# Patient Record
Sex: Male | Born: 1965 | Race: Black or African American | Hispanic: No | Marital: Single | Smoking: Current every day smoker
Health system: Southern US, Community
[De-identification: ages and names within clinical notes are randomized; demographics above are authoritative.]

## PROBLEM LIST (undated history)

## (undated) DIAGNOSIS — E119 Type 2 diabetes mellitus without complications: Secondary | ICD-10-CM

## (undated) DIAGNOSIS — I10 Essential (primary) hypertension: Secondary | ICD-10-CM

---

## 2018-04-11 ENCOUNTER — Other Ambulatory Visit: Payer: Self-pay

## 2018-04-11 ENCOUNTER — Emergency Department (HOSPITAL_BASED_OUTPATIENT_CLINIC_OR_DEPARTMENT_OTHER): Payer: Self-pay

## 2018-04-11 ENCOUNTER — Inpatient Hospital Stay (HOSPITAL_BASED_OUTPATIENT_CLINIC_OR_DEPARTMENT_OTHER)
Admission: EM | Admit: 2018-04-11 | Discharge: 2018-04-16 | DRG: 871 | Disposition: A | Discharge status: Home / Self Care | Payer: Self-pay | Attending: Internal Medicine | Admitting: Internal Medicine

## 2018-04-11 ENCOUNTER — Encounter (HOSPITAL_BASED_OUTPATIENT_CLINIC_OR_DEPARTMENT_OTHER): Payer: Self-pay | Admitting: *Deleted

## 2018-04-11 DIAGNOSIS — F09 Unspecified mental disorder due to known physiological condition: Secondary | ICD-10-CM | POA: Diagnosis present

## 2018-04-11 DIAGNOSIS — D631 Anemia in chronic kidney disease: Secondary | ICD-10-CM | POA: Diagnosis present

## 2018-04-11 DIAGNOSIS — R945 Abnormal results of liver function studies: Secondary | ICD-10-CM | POA: Diagnosis present

## 2018-04-11 DIAGNOSIS — F4320 Adjustment disorder, unspecified: Secondary | ICD-10-CM | POA: Diagnosis present

## 2018-04-11 DIAGNOSIS — Z79899 Other long term (current) drug therapy: Secondary | ICD-10-CM

## 2018-04-11 DIAGNOSIS — R0600 Dyspnea, unspecified: Secondary | ICD-10-CM

## 2018-04-11 DIAGNOSIS — R4689 Other symptoms and signs involving appearance and behavior: Secondary | ICD-10-CM | POA: Diagnosis not present

## 2018-04-11 DIAGNOSIS — I10 Essential (primary) hypertension: Secondary | ICD-10-CM

## 2018-04-11 DIAGNOSIS — A419 Sepsis, unspecified organism: Secondary | ICD-10-CM

## 2018-04-11 DIAGNOSIS — R0602 Shortness of breath: Secondary | ICD-10-CM

## 2018-04-11 DIAGNOSIS — E1169 Type 2 diabetes mellitus with other specified complication: Secondary | ICD-10-CM | POA: Diagnosis present

## 2018-04-11 DIAGNOSIS — E872 Acidosis, unspecified: Secondary | ICD-10-CM

## 2018-04-11 DIAGNOSIS — I700 Atherosclerosis of aorta: Secondary | ICD-10-CM | POA: Diagnosis present

## 2018-04-11 DIAGNOSIS — F319 Bipolar disorder, unspecified: Secondary | ICD-10-CM | POA: Diagnosis present

## 2018-04-11 DIAGNOSIS — T380X5A Adverse effect of glucocorticoids and synthetic analogues, initial encounter: Secondary | ICD-10-CM | POA: Diagnosis not present

## 2018-04-11 DIAGNOSIS — E1165 Type 2 diabetes mellitus with hyperglycemia: Secondary | ICD-10-CM | POA: Diagnosis present

## 2018-04-11 DIAGNOSIS — Z6838 Body mass index (BMI) 38.0-38.9, adult: Secondary | ICD-10-CM

## 2018-04-11 DIAGNOSIS — E871 Hypo-osmolality and hyponatremia: Secondary | ICD-10-CM | POA: Diagnosis present

## 2018-04-11 DIAGNOSIS — N179 Acute kidney failure, unspecified: Secondary | ICD-10-CM | POA: Diagnosis present

## 2018-04-11 DIAGNOSIS — Z7984 Long term (current) use of oral hypoglycemic drugs: Secondary | ICD-10-CM

## 2018-04-11 DIAGNOSIS — F17200 Nicotine dependence, unspecified, uncomplicated: Secondary | ICD-10-CM | POA: Diagnosis present

## 2018-04-11 DIAGNOSIS — E669 Obesity, unspecified: Secondary | ICD-10-CM | POA: Diagnosis present

## 2018-04-11 DIAGNOSIS — B349 Viral infection, unspecified: Secondary | ICD-10-CM | POA: Diagnosis present

## 2018-04-11 DIAGNOSIS — Y92230 Patient room in hospital as the place of occurrence of the external cause: Secondary | ICD-10-CM

## 2018-04-11 DIAGNOSIS — A4189 Other specified sepsis: Principal | ICD-10-CM | POA: Diagnosis present

## 2018-04-11 DIAGNOSIS — E860 Dehydration: Secondary | ICD-10-CM | POA: Diagnosis present

## 2018-04-11 DIAGNOSIS — R3129 Other microscopic hematuria: Secondary | ICD-10-CM

## 2018-04-11 DIAGNOSIS — Z720 Tobacco use: Secondary | ICD-10-CM

## 2018-04-11 DIAGNOSIS — J189 Pneumonia, unspecified organism: Secondary | ICD-10-CM | POA: Diagnosis present

## 2018-04-11 DIAGNOSIS — K760 Fatty (change of) liver, not elsewhere classified: Secondary | ICD-10-CM | POA: Diagnosis present

## 2018-04-11 DIAGNOSIS — I129 Hypertensive chronic kidney disease with stage 1 through stage 4 chronic kidney disease, or unspecified chronic kidney disease: Secondary | ICD-10-CM | POA: Diagnosis present

## 2018-04-11 DIAGNOSIS — J181 Lobar pneumonia, unspecified organism: Secondary | ICD-10-CM | POA: Diagnosis present

## 2018-04-11 DIAGNOSIS — N182 Chronic kidney disease, stage 2 (mild): Secondary | ICD-10-CM | POA: Diagnosis present

## 2018-04-11 DIAGNOSIS — I722 Aneurysm of renal artery: Secondary | ICD-10-CM | POA: Diagnosis present

## 2018-04-11 HISTORY — DX: Type 2 diabetes mellitus without complications: E11.9

## 2018-04-11 HISTORY — DX: Essential (primary) hypertension: I10

## 2018-04-11 LAB — URINALYSIS, ROUTINE W REFLEX MICROSCOPIC
Bilirubin Urine: NEGATIVE
Ketones, ur: 15 mg/dL — AB
Leukocytes, UA: NEGATIVE
Nitrite: NEGATIVE
PROTEIN: 100 mg/dL — AB
Specific Gravity, Urine: 1.015 (ref 1.005–1.030)
pH: 6 (ref 5.0–8.0)

## 2018-04-11 LAB — RAPID URINE DRUG SCREEN, HOSP PERFORMED
Amphetamines: NOT DETECTED
BENZODIAZEPINES: NOT DETECTED
COCAINE: NOT DETECTED
Opiates: NOT DETECTED
Tetrahydrocannabinol: NOT DETECTED

## 2018-04-11 LAB — COMPREHENSIVE METABOLIC PANEL
ALBUMIN: 3.6 g/dL (ref 3.5–5.0)
ALK PHOS: 57 U/L (ref 38–126)
ALT: 34 U/L (ref 17–63)
ANION GAP: 12 (ref 5–15)
AST: 37 U/L (ref 15–41)
BILIRUBIN TOTAL: 2.2 mg/dL — AB (ref 0.3–1.2)
BUN: 13 mg/dL (ref 6–20)
CALCIUM: 8.2 mg/dL — AB (ref 8.9–10.3)
CO2: 20 mmol/L — ABNORMAL LOW (ref 22–32)
CREATININE: 1.27 mg/dL — AB (ref 0.61–1.24)
Chloride: 92 mmol/L — ABNORMAL LOW (ref 101–111)
GFR calc Af Amer: >60 mL/min (ref >60)
GFR calc non Af Amer: >60 mL/min (ref >60)
GLUCOSE: 426 mg/dL — AB (ref 65–99)
Potassium: 3.6 mmol/L (ref 3.5–5.1)
Sodium: 124 mmol/L — ABNORMAL LOW (ref 135–145)
TOTAL PROTEIN: 7.6 g/dL (ref 6.5–8.1)

## 2018-04-11 LAB — BRAIN NATRIURETIC PEPTIDE: B Natriuretic Peptide: 28.6 pg/mL (ref 0.0–100.0)

## 2018-04-11 LAB — URINALYSIS, MICROSCOPIC (REFLEX)

## 2018-04-11 LAB — CBC WITH DIFFERENTIAL/PLATELET
BASOS ABS: 0 10*3/uL (ref 0.0–0.1)
BASOS PCT: 0 %
EOS ABS: 0 10*3/uL (ref 0.0–0.7)
EOS PCT: 0 %
HCT: 38 % — ABNORMAL LOW (ref 39.0–52.0)
Hemoglobin: 13.3 g/dL (ref 13.0–17.0)
LYMPHS ABS: 1.6 10*3/uL (ref 0.7–4.0)
Lymphocytes Relative: 15 %
MCH: 30.2 pg (ref 26.0–34.0)
MCHC: 35 g/dL (ref 30.0–36.0)
MCV: 86.4 fL (ref 78.0–100.0)
Monocytes Absolute: 1.1 10*3/uL — ABNORMAL HIGH (ref 0.1–1.0)
Monocytes Relative: 11 %
Neutro Abs: 7.5 10*3/uL (ref 1.7–7.7)
Neutrophils Relative %: 74 %
Platelets: 224 10*3/uL (ref 150–400)
RBC: 4.4 MIL/uL (ref 4.22–5.81)
RDW: 12.2 % (ref 11.5–15.5)
WBC: 10.2 10*3/uL (ref 4.0–10.5)

## 2018-04-11 LAB — EXPECTORATED SPUTUM ASSESSMENT W GRAM STAIN, RFLX TO RESP C

## 2018-04-11 LAB — CREATININE, SERUM
Creatinine, Ser: 1.29 mg/dL — ABNORMAL HIGH (ref 0.61–1.24)
GFR calc non Af Amer: >60 mL/min (ref >60)

## 2018-04-11 LAB — CBC
HCT: 34.9 % — ABNORMAL LOW (ref 39.0–52.0)
Hemoglobin: 12 g/dL — ABNORMAL LOW (ref 13.0–17.0)
MCH: 29.9 pg (ref 26.0–34.0)
MCHC: 34.4 g/dL (ref 30.0–36.0)
MCV: 87 fL (ref 78.0–100.0)
Platelets: 229 10*3/uL (ref 150–400)
RBC: 4.01 MIL/uL — ABNORMAL LOW (ref 4.22–5.81)
RDW: 12.4 % (ref 11.5–15.5)
WBC: 8.7 10*3/uL (ref 4.0–10.5)

## 2018-04-11 LAB — INFLUENZA PANEL BY PCR (TYPE A & B)
Influenza A By PCR: NEGATIVE
Influenza B By PCR: NEGATIVE

## 2018-04-11 LAB — ETHANOL

## 2018-04-11 LAB — TSH: TSH: 1.612 u[IU]/mL (ref 0.350–4.500)

## 2018-04-11 LAB — CBG MONITORING, ED
GLUCOSE-CAPILLARY: 368 mg/dL — AB (ref 65–99)
Glucose-Capillary: 437 mg/dL — ABNORMAL HIGH (ref 65–99)

## 2018-04-11 LAB — STREP PNEUMONIAE URINARY ANTIGEN: Strep Pneumo Urinary Antigen: NEGATIVE

## 2018-04-11 LAB — RAPID STREP SCREEN (MED CTR MEBANE ONLY): Streptococcus, Group A Screen (Direct): NEGATIVE

## 2018-04-11 LAB — GLUCOSE, CAPILLARY
Glucose-Capillary: 358 mg/dL — ABNORMAL HIGH (ref 65–99)
Glucose-Capillary: 352 mg/dL — ABNORMAL HIGH (ref 65–99)

## 2018-04-11 LAB — PROCALCITONIN: PROCALCITONIN: 0.87 ng/mL

## 2018-04-11 LAB — TROPONIN I

## 2018-04-11 LAB — LACTIC ACID, PLASMA
Lactic Acid, Venous: 1.4 mmol/L (ref 0.5–1.9)
Lactic Acid, Venous: 1.7 mmol/L (ref 0.5–1.9)

## 2018-04-11 LAB — EXPECTORATED SPUTUM ASSESSMENT W REFEX TO RESP CULTURE

## 2018-04-11 LAB — I-STAT CG4 LACTIC ACID, ED
LACTIC ACID, VENOUS: 2.16 mmol/L — AB (ref 0.5–1.9)
Lactic Acid, Venous: 1.53 mmol/L (ref 0.5–1.9)

## 2018-04-11 MED ORDER — IBUPROFEN 400 MG PO TABS
400.0000 mg | ORAL_TABLET | Freq: Once | ORAL | Status: AC
  Administered 2018-04-11: 400 mg via ORAL
  Filled 2018-04-11: qty 1

## 2018-04-11 MED ORDER — ACETAMINOPHEN 325 MG PO TABS
650.0000 mg | ORAL_TABLET | Freq: Four times a day (QID) | ORAL | Status: DC | PRN
  Administered 2018-04-11 – 2018-04-12 (×3): 650 mg via ORAL
  Filled 2018-04-11 (×3): qty 2

## 2018-04-11 MED ORDER — ORAL CARE MOUTH RINSE
15.0000 mL | Freq: Two times a day (BID) | OROMUCOSAL | Status: DC
  Administered 2018-04-13: 15 mL via OROMUCOSAL

## 2018-04-11 MED ORDER — ACETAMINOPHEN 325 MG PO TABS
325.0000 mg | ORAL_TABLET | Freq: Once | ORAL | Status: AC
  Administered 2018-04-11: 325 mg via ORAL
  Filled 2018-04-11: qty 1

## 2018-04-11 MED ORDER — INSULIN ASPART 100 UNIT/ML ~~LOC~~ SOLN
0.0000 [IU] | Freq: Every day | SUBCUTANEOUS | Status: DC
  Administered 2018-04-11: 5 [IU] via SUBCUTANEOUS
  Administered 2018-04-12: 3 [IU] via SUBCUTANEOUS
  Administered 2018-04-14: 5 [IU] via SUBCUTANEOUS

## 2018-04-11 MED ORDER — VANCOMYCIN HCL IN DEXTROSE 1-5 GM/200ML-% IV SOLN
1000.0000 mg | Freq: Once | INTRAVENOUS | Status: AC
  Administered 2018-04-11: 1000 mg via INTRAVENOUS
  Filled 2018-04-11: qty 200

## 2018-04-11 MED ORDER — ONDANSETRON HCL 4 MG/2ML IJ SOLN
4.0000 mg | Freq: Once | INTRAMUSCULAR | Status: AC
  Administered 2018-04-11: 4 mg via INTRAVENOUS
  Filled 2018-04-11: qty 2

## 2018-04-11 MED ORDER — SODIUM CHLORIDE 0.9 % IV SOLN
500.0000 mg | INTRAVENOUS | Status: DC
  Administered 2018-04-11 – 2018-04-15 (×5): 500 mg via INTRAVENOUS
  Filled 2018-04-11 (×7): qty 500

## 2018-04-11 MED ORDER — SODIUM CHLORIDE 0.9 % IV SOLN
1.0000 g | INTRAVENOUS | Status: DC
  Administered 2018-04-11 – 2018-04-14 (×4): 1 g via INTRAVENOUS
  Filled 2018-04-11 (×2): qty 1
  Filled 2018-04-11 (×2): qty 10

## 2018-04-11 MED ORDER — ALBUTEROL SULFATE (2.5 MG/3ML) 0.083% IN NEBU: 2.5000 mg | INHALATION_SOLUTION | RESPIRATORY_TRACT | Status: DC | PRN

## 2018-04-11 MED ORDER — VANCOMYCIN HCL 10 G IV SOLR
2000.0000 mg | Freq: Once | INTRAVENOUS | Status: DC
  Filled 2018-04-11: qty 2000

## 2018-04-11 MED ORDER — GUAIFENESIN ER 600 MG PO TB12
1200.0000 mg | ORAL_TABLET | Freq: Two times a day (BID) | ORAL | Status: DC
  Administered 2018-04-11 – 2018-04-16 (×10): 1200 mg via ORAL
  Filled 2018-04-11 (×10): qty 2

## 2018-04-11 MED ORDER — CHLORHEXIDINE GLUCONATE 0.12 % MT SOLN
15.0000 mL | Freq: Two times a day (BID) | OROMUCOSAL | Status: DC
  Administered 2018-04-12 – 2018-04-16 (×9): 15 mL via OROMUCOSAL
  Filled 2018-04-11 (×10): qty 15

## 2018-04-11 MED ORDER — LACTATED RINGERS IV BOLUS (SEPSIS)
1000.0000 mL | Freq: Once | INTRAVENOUS | Status: AC
  Administered 2018-04-11: 1000 mL via INTRAVENOUS

## 2018-04-11 MED ORDER — SODIUM CHLORIDE 0.9 % IV SOLN
INTRAVENOUS | Status: DC
  Administered 2018-04-11 – 2018-04-13 (×5): via INTRAVENOUS

## 2018-04-11 MED ORDER — IOPAMIDOL (ISOVUE-300) INJECTION 61%
100.0000 mL | Freq: Once | INTRAVENOUS | Status: AC | PRN
  Administered 2018-04-11: 100 mL via INTRAVENOUS

## 2018-04-11 MED ORDER — ENOXAPARIN SODIUM 40 MG/0.4ML ~~LOC~~ SOLN
40.0000 mg | SUBCUTANEOUS | Status: DC
  Administered 2018-04-11 – 2018-04-14 (×4): 40 mg via SUBCUTANEOUS
  Filled 2018-04-11 (×4): qty 0.4

## 2018-04-11 MED ORDER — IPRATROPIUM-ALBUTEROL 0.5-2.5 (3) MG/3ML IN SOLN: 3.0000 mL | Freq: Four times a day (QID) | RESPIRATORY_TRACT | Status: DC

## 2018-04-11 MED ORDER — IPRATROPIUM-ALBUTEROL 0.5-2.5 (3) MG/3ML IN SOLN: 3.0000 mL | Freq: Four times a day (QID) | RESPIRATORY_TRACT | Status: DC | PRN

## 2018-04-11 MED ORDER — PIPERACILLIN-TAZOBACTAM 3.375 G IVPB 30 MIN
3.3750 g | Freq: Once | INTRAVENOUS | Status: AC
  Administered 2018-04-11: 3.375 g via INTRAVENOUS
  Filled 2018-04-11 (×2): qty 50

## 2018-04-11 MED ORDER — PIPERACILLIN-TAZOBACTAM 3.375 G IVPB
3.3750 g | Freq: Three times a day (TID) | INTRAVENOUS | Status: DC
  Filled 2018-04-11: qty 50

## 2018-04-11 MED ORDER — INSULIN ASPART 100 UNIT/ML ~~LOC~~ SOLN
0.0000 [IU] | Freq: Three times a day (TID) | SUBCUTANEOUS | Status: DC
  Administered 2018-04-11: 15 [IU] via SUBCUTANEOUS
  Administered 2018-04-12: 5 [IU] via SUBCUTANEOUS
  Administered 2018-04-12: 8 [IU] via SUBCUTANEOUS
  Administered 2018-04-12: 11 [IU] via SUBCUTANEOUS
  Administered 2018-04-13: 8 [IU] via SUBCUTANEOUS
  Administered 2018-04-13: 15 [IU] via SUBCUTANEOUS
  Administered 2018-04-13: 8 [IU] via SUBCUTANEOUS
  Administered 2018-04-14: 15 [IU] via SUBCUTANEOUS
  Administered 2018-04-14: 20 [IU] via SUBCUTANEOUS
  Administered 2018-04-14: 15 [IU] via SUBCUTANEOUS

## 2018-04-11 MED ORDER — ACETAMINOPHEN 500 MG PO TABS
1000.0000 mg | ORAL_TABLET | Freq: Once | ORAL | Status: AC
  Administered 2018-04-11: 1000 mg via ORAL
  Filled 2018-04-11: qty 2

## 2018-04-11 MED ORDER — SODIUM CHLORIDE 0.9 % IV SOLN
INTRAVENOUS | Status: DC
  Administered 2018-04-11: 17:00:00 via INTRAVENOUS

## 2018-04-11 MED ORDER — INSULIN ASPART 100 UNIT/ML ~~LOC~~ SOLN: 0.0000 [IU] | Freq: Three times a day (TID) | SUBCUTANEOUS | Status: DC

## 2018-04-11 MED ORDER — LACTATED RINGERS IV BOLUS (SEPSIS)
800.0000 mL | Freq: Once | INTRAVENOUS | Status: AC
  Administered 2018-04-11: 800 mL via INTRAVENOUS

## 2018-04-11 MED ORDER — SODIUM CHLORIDE 0.9 % IV BOLUS
1000.0000 mL | Freq: Once | INTRAVENOUS | Status: AC
  Administered 2018-04-11: 1000 mL via INTRAVENOUS

## 2018-04-11 NOTE — ED Notes (Signed)
Report to [place], RN at [facility]. 

## 2018-04-11 NOTE — ED Notes (Signed)
Patient transported to CT 

## 2018-04-11 NOTE — ED Notes (Signed)
[facility]

## 2018-04-11 NOTE — Progress Notes (Addendum)
   [facility]. Patient with chief complaint of cough, fever, generalized weakness. Temp 103.2, HR 117, RR 20s in the ED. Also abdominal pain and vomiting, CT A/P without acute abdominal process, but did show lingular pneumonia. Patient started on IV zosyn, vanco. [facility]     Noralee StainJennifer Carliss Quast, DO [facility] [email or web address] Password [email or web address] 04/11/2018, 1:10 PM

## 2018-04-11 NOTE — ED Provider Notes (Signed)
Emergency Department Provider Note   I have reviewed the triage vital signs and the nursing notes.   HISTORY  Chief Complaint Fever   HPI William Marks is a 52 y.o. male with approximately 2 weeks of symptoms.  Patient states that during that time he has had multiple symptoms to include fever as high as 104, cough, abdominal pain, headache, neck pain, all over body aches, productive cough that is brown and sputum color.  Patient states that last 4 to 5 days the nausea is gotten significantly worse and has been vomiting every day.  Worsening abdominal pain and weight loss.  Also dark urine and decreased urine output but no burning or other dysuria.  States he thinks he might have the flu however has not been exposing but it with the flu. No other associated or modifying symptoms.    Past Medical History:  Diagnosis Date  . Diabetes mellitus without complication (HCC)   . Hypertension     There are no active problems to display for this patient.   History reviewed. No pertinent surgical history.    Allergies Patient has no known allergies.  History reviewed. No pertinent family history.  Social History Social History   Tobacco Use  . Smoking status: Current Every Day Smoker  . Smokeless tobacco: Never Used  Substance Use Topics  . Alcohol use: Not Currently  . Drug use: Not on file    Review of Systems  All other systems negative except as documented in the HPI. All pertinent positives and negatives as reviewed in the HPI. ____________________________________________   PHYSICAL EXAM:  VITAL SIGNS: ED Triage Vitals  Enc Vitals Group     BP 04/11/18 0923 (!) 144/90     Pulse Rate 04/11/18 0923 (!) 117     Resp 04/11/18 0923 20     Temp 04/11/18 0923 (!) 103.2 F (39.6 C)     Temp Source 04/11/18 0923 Oral     SpO2 04/11/18 0923 98 %     Weight 04/11/18 0918 (!) 320 lb (145.2 kg)     Height 04/11/18 0918 6\' 4"  (1.93 m)    Constitutional: Alert and  oriented. Well appearing and in no acute distress. Eyes: Conjunctivae are normal. PERRL. EOMI. Head: Atraumatic. Nose: No congestion/rhinnorhea. Mouth/Throat: Mucous membranes are moist.  Oropharynx non-erythematous. Neck: No stridor.  No meningeal signs.   Cardiovascular: tachycardic rate, regular rhythm. Good peripheral circulation. Grossly normal heart sounds.   Respiratory: Normal respiratory effort.  No retractions. Lungs CTAB. Gastrointestinal: Soft and ttp in left upper and lower quadrant with voluntary guarding. Obese, but no obvious distension.  Musculoskeletal: No lower extremity tenderness nor edema. No gross deformities of extremities. Neurologic:  Normal speech and language. No gross focal neurologic deficits are appreciated.  Skin:  Skin is warm, dry and intact. No rash noted.   ____________________________________________   LABS (all labs ordered are listed, but only abnormal results are displayed)  Labs Reviewed  CBC WITH DIFFERENTIAL/PLATELET - Abnormal; Notable for the following components:      Result Value   HCT 38.0 (*)    Monocytes Absolute 1.1 (*)    All other components within normal limits  URINALYSIS, ROUTINE W REFLEX MICROSCOPIC - Abnormal; Notable for the following components:   Glucose, UA >=500 (*)    Hgb urine dipstick LARGE (*)    Ketones, ur 15 (*)    Protein, ur 100 (*)    All other components within normal limits  COMPREHENSIVE METABOLIC PANEL -  Abnormal; Notable for the following components:   Sodium 124 (*)    Chloride 92 (*)    CO2 20 (*)    Glucose, Bld 426 (*)    Creatinine, Ser 1.27 (*)    Calcium 8.2 (*)    Total Bilirubin 2.2 (*)    All other components within normal limits  URINALYSIS, MICROSCOPIC (REFLEX) - Abnormal; Notable for the following components:   Bacteria, UA FEW (*)    All other components within normal limits  CBG MONITORING, ED - Abnormal; Notable for the following components:   Glucose-Capillary 437 (*)    All  other components within normal limits  I-STAT CG4 LACTIC ACID, ED - Abnormal; Notable for the following components:   Lactic Acid, Venous 2.16 (*)    All other components within normal limits  CULTURE, BLOOD (ROUTINE X 2)  RAPID STREP SCREEN ([facility] ONLY)  CULTURE, BLOOD (ROUTINE X 2)  CULTURE, GROUP A STREP [facility])  INFLUENZA PANEL BY PCR (TYPE A & B)  I-STAT CG4 LACTIC ACID, ED   ____________________________________________  EKG   EKG Interpretation  Date/Time:  Thursday April 11 2018 09:44:16 EDT Ventricular Rate:  111 PR Interval:    QRS Duration: 81 QT Interval:  306 QTC Calculation: 416 R Axis:   8 Text Interpretation:  Sinus tachycardia Probable left atrial enlargement No old tracing to compare Confirmed by Marily Memos [phone]) on 04/11/2018 10:02:59 AM       ____________________________________________  RADIOLOGY  Dg Chest 2 View  Result Date: 04/11/2018 CLINICAL DATA:  Fevers, body aches, fatigue. EXAM: CHEST - 2 VIEW COMPARISON:  No prior. FINDINGS: Mediastinum and hilar structures normal. Prominent left mid lung field infiltrate noted consistent pneumonia. No pleural effusion or pneumothorax. Heart size normal. No acute bony abnormality. IMPRESSION: Prominent left mid lung field infiltrate consistent with pneumonia. Followup PA and lateral chest X-ray is recommended in 3-4 weeks following trial of antibiotic therapy to ensure resolution and exclude underlying malignancy. Electronically Signed   By: Maisie Fus  Register   On: 04/11/2018 11:41   Ct Abdomen Pelvis W Contrast  Result Date: 04/11/2018 CLINICAL DATA:  Two week history of fevers, fatigue, and body aches. EXAM: CT ABDOMEN AND PELVIS WITH CONTRAST TECHNIQUE: Multidetector CT imaging of the abdomen and pelvis was performed using the standard protocol following bolus administration of intravenous contrast. CONTRAST:  ISOVUE-300 IOPAMIDOL (ISOVUE-300) INJECTION 61% COMPARISON:  None. FINDINGS: Lower chest:  Partially visualized patchy consolidation in the lingula. Hepatobiliary: Diffuse hepatic steatosis. No focal liver abnormality. The gallbladder is decompressed. No biliary dilatation. Pancreas: Unremarkable. No pancreatic ductal dilatation or surrounding inflammatory changes. Spleen: Normal in size without focal abnormality. Adrenals/Urinary Tract: The adrenal glands are unremarkable. Small bilateral renal simple cyst. Subcentimeter low-density lesion in the upper pole of the left kidney is too small to characterize. No renal or ureteral calculi. No hydronephrosis. The bladder is unremarkable. Stomach/Bowel: Stomach is within normal limits. Appendix appears normal. No evidence of bowel wall thickening, distention, or inflammatory changes. Vascular/Lymphatic: Mild aortic atherosclerosis. 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 No enlarged abdominal or pelvic lymph nodes. Reproductive: Prostate is unremarkable. Other: Small fat containing right inguinal hernia. No free fluid or pneumoperitoneum. Musculoskeletal: No acute or significant osseous findings. Moderate degenerative disc disease at L5-S1. IMPRESSION: 1. Lingular pneumonia. 2.  No acute intra-abdominal process. 3. Diffuse hepatic steatosis. 4. 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 Follow-up CT angiogram of the abdomen in 1-2 years is recommended. This recommendation follows ACR consensus guidelines: White Paper of the ACR Incidental Findings Committee II on Vascular Findings. J Am Coll Radiol [phone]. Electronically Signed   By: Obie Dredge M.D.   On: 04/11/2018 11:50    ____________________________________________   PROCEDURES  Procedure(s) performed:   Procedures  CRITICAL CARE Performed by: Marily Memos Total critical care time: 35 minutes Critical care time was exclusive of separately billable  procedures and treating other patients. Critical care was necessary to treat or prevent imminent or life-threatening deterioration. Critical care was time spent personally by me on the following activities: development of treatment plan with patient and/or surrogate as well as nursing, discussions with consultants, evaluation of patient's response to treatment, examination of patient, obtaining history from patient or surrogate, ordering and performing treatments and interventions, ordering and review of laboratory studies, ordering and review of radiographic studies, pulse oximetry and re-evaluation of patient's condition.  ____________________________________________   INITIAL IMPRESSION / ASSESSMENT AND PLAN / ED COURSE  Code sepsis.  Suspect abdominal source with the abdominal tenderness and the vomiting recently so we will start Zosyn.  Could also be respiratory so will do vancomycin as well. tylneol for fever. Disposition based on results and reevaluation.   CT abdomen/pelvis ok but does show lingular pneumonia. Already had appropriate antibiotics. Still tachypneic/pulse ox as low as 91 but generally around 95. When ambulating got much more dyspneic and subsequently weak, not able to continue walking. Will consult for admission for dehydration/infection.     Pertinent labs & imaging results that were available during my care of the patient were reviewed by me and considered in my medical decision making (see chart for details).  ____________________________________________  FINAL CLINICAL IMPRESSION(S) / ED DIAGNOSES  Final diagnoses:  Community acquired pneumonia of left lung, unspecified part of lung     MEDICATIONS GIVEN DURING THIS VISIT:  Medications  piperacillin-tazobactam (ZOSYN) IVPB 3.375 g (has no administration in time range)  lactated ringers bolus 1,000 mL (0 mLs Intravenous Stopped 04/11/18 1004)    And  lactated ringers bolus 1,000 mL (0 mLs Intravenous Stopped  04/11/18 1133)    And  lactated ringers bolus 800 mL (0 mLs Intravenous Stopped 04/11/18 1242)  piperacillin-tazobactam (ZOSYN) IVPB 3.375 g (0 g Intravenous Stopped 04/11/18 1007)  acetaminophen (TYLENOL) tablet 1,000 mg (1,000 mg Oral Given 04/11/18 0949)  vancomycin (VANCOCIN) IVPB 1000 mg/200 mL premix (0 mg Intravenous Stopped 04/11/18 1131)    Followed by  vancomycin (VANCOCIN) IVPB 1000 mg/200 mL premix (0 mg Intravenous Stopped 04/11/18 1242)  ondansetron (ZOFRAN) injection 4 mg (4 mg Intravenous Given 04/11/18 1015)  iopamidol (ISOVUE-300) 61 % injection 100 mL (100 mLs Intravenous Contrast Given 04/11/18 1109)     NEW OUTPATIENT MEDICATIONS STARTED DURING THIS VISIT:  New Prescriptions   No medications on file    Note:  This note was prepared with assistance of Dragon voice recognition software. Occasional wrong-word or sound-a-like substitutions may have occurred due to the inherent limitations of voice recognition software.   Marily Memos, MD 04/11/18 1446

## 2018-04-11 NOTE — ED Triage Notes (Signed)
Pt reports 2 weeks of intermittent fevers, up to 104, generalized body aches, fatigue, states he hasn't been able to eat, fsbs at triage is 437.

## 2018-04-11 NOTE — Progress Notes (Signed)
Pharmacy Antibiotic Note  William LynchRudolph Colin is a 52 y.o. male admitted on 04/11/2018 with fevers, body aches, fatigue and concern for sepsis of abdominal source. Pharmacy has been consulted for Zosyn dosing.  Vanc 2g IV x 1 and Zosyn 3.375g IV x 1 ordered by the ED-MD  Plan: - Continue Zosyn 3.375g IV every 8 hours  - Will follow-up need for additional Vancomycin dosing - Will continue to follow renal function, culture results, LOT, and antibiotic de-escalation plans   Height: 6\' 4"  (193 cm) Weight: (!) 320 lb (145.2 kg) IBW/kg (Calculated) : 86.8  Temp (24hrs), Avg:103.2 F (39.6 C), Min:103.2 F (39.6 C), Max:103.2 F (39.6 C)  No results for input(s): WBC, CREATININE, LATICACIDVEN, VANCOTROUGH, VANCOPEAK, VANCORANDOM, GENTTROUGH, GENTPEAK, GENTRANDOM, TOBRATROUGH, TOBRAPEAK, TOBRARND, AMIKACINPEAK, AMIKACINTROU, AMIKACIN in the last 168 hours.  CrCl cannot be calculated (No order found.).    No Known Allergies  Antimicrobials this admission: Vanc 6/20 x 1 Zosy 6/20 >>  Dose adjustments this admission: n/a  Microbiology results: pending  Thank you for allowing pharmacy to be a part of this patient's care.  Rolley SimsMartin, Samariyah Cowles Ann 04/11/2018 9:45 AM

## 2018-04-11 NOTE — ED Notes (Signed)
Pt maintained O2 sat of 98% while ambulating; HR 106; pt was not able to ambulate past door of room before becoming too weak to continue; stretcher was brought to pt and pt assisted back to stretcher.

## 2018-04-11 NOTE — H&P (Signed)
History and Physical    William Marks DEY:[id number] DOB: 1966-04-09 DOA: 04/11/2018  PCP:  Vira Browns, NP in [place]   Patient coming from: Home via Travel to [place] for work  Chief Complaint: Feeling Sicking   HPI: William Marks is a 52 y.o. 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  Patient states symptoms started about 2 weeks ago and about 10 days ago he started having some nausea and vomiting daily and then developed some diarrhea on and off with 2-3  loose and sometimes watery movements a day.  Last bowel movement was yesterday was nonbloody  and emesis is also nonbloody.  Patient also admits to having some dizziness and states that he developed progressively worsening shortness of breath and states that even when he lays flat  he is short of breath.  Denies any coughing up any sputum or any urinary complaints.  Denies any dysuria but did admit to having some abdominal cramps and pain on the left side of his abdomen.  Patient also complains of a on and off headache with dizziness but denies any chest pain, flank pain, blurred vision or double vision.  Patient states that for the last 4 days he has been trying to call over to come to the hospital but was unable to because of feeling sick and finally was able to and presented to the [facility] where he was transferred to [facility] long hospital for admission for sepsis secondary to community-acquired lingular pneumonia.  TRH was called to admit this patient for the above complaints.  ED Course: In the ED sepsis protocol was initiated and patient had a chest x-ray as well as a CT of the abdomen and pelvis which revealed patient having a lingular consolidation.  Patient was placed on IV antibiotics and given 2.8 L fluid resuscitation boluses and had basic blood work done.  He was transferred to [facility] hospital  for admission  Review of Systems: As per HPI otherwise 10 point review of systems negative.   Past Medical History:  Diagnosis Date  . Diabetes mellitus without complication ([place])   . Hypertension    SURGICAL HISTORY History reviewed.Only Surgery was Wisdom tooth Extraction and Left Index Finger Surgery   SOCIAL HISTORY Reports that he has been smoking 1PPD from the age of 54 up until a month ago when he decided he wanted to quit.  He has never used smokeless tobacco. He reports that he drank alcohol occasionally. His drug history is not on file but he denies use of illicit substances  ALLERGIES No Known Allergies  FAMILY HISTORY History reviewed. Patient's Father was deceased when patient was 29 months old and no History known about patient's Father. Mom has HTN.   Prior to Admission medications   Medication Sig Start Date End Date Taking? Authorizing Provider  glimepiride (AMARYL) 4 MG tablet Take 4 mg by mouth daily with breakfast.   Yes [provider]  losartan-hydrochlorothiazide (HYZAAR) 100-12.5 MG tablet Take 1 tablet by mouth daily. 02/06/18  Yes [provider]  metFORMIN (GLUCOPHAGE) 1000 MG tablet Take 1,000 mg by mouth 2 (two) times daily with a meal.   Yes [provider]   Physical Exam: Vitals:   04/11/18 1450 04/11/18 1500 04/11/18 1515 04/11/18 1708  BP: (!) 164/95 (!) 158/89  (!) 97/32  Pulse: (!) 112 (!) 107 (!) 111 87  Resp: (!) 22 (!) 26 (!) 26 18  Temp:    99.9 F (37.7 C)  TempSrc:    Oral  SpO2: 99% 96% 96% 95%  Weight:      Height:       Constitutional: WN/WD obese AAM who is ill appearing and slightly diaphoretic  Eyes: Lids and conjunctivae normal, sclerae anicteric  ENMT: External Ears, Nose appear normal. Grossly normal hearing.  Neck: Appears normal, supple, no cervical masses, normal ROM, no appreciable thyromegaly, no JVD Respiratory: Diminished to auscultation bilaterally, no wheezing, rales, rhonchi or crackles.  Normal respiratory effort and patient is not tachypenic. No accessory muscle use at this time.  Cardiovascular: Tachycardic Rate but regular rhythm, no murmurs / rubs / gallops. S1 and S2 auscultated. No extremity edema appreciated.  Abdomen: Soft, non-tender, Distended due to body habitus. No masses palpated. No appreciable hepatosplenomegaly. Bowel sounds positive x4.  GU: Deferred. Musculoskeletal: No clubbing / cyanosis of digits/nails. No joint deformity upper and lower extremities.  Skin: No rashes or lesions on a limited skin evaluation.  No induration; Warm and dry.  Neurologic: CN 2-12 grossly intact with no focal deficits. Romberg sign and cerebellar reflexes not assessed.  Psychiatric: Normal judgment and insight. Alert and oriented x 3. Normal mood and appropriate affect.   Labs on Admission: I have personally reviewed following labs and imaging studies  CBC: Recent Labs  Lab 04/11/18 0947 04/11/18 1717  WBC 10.2 8.7  NEUTROABS 7.5  --   HGB 13.3 12.0*  HCT 38.0* 34.9*  MCV 86.4 87.0  PLT 224 945   Basic Metabolic Panel: Recent Labs  Lab 04/11/18 1025  NA 124*  K 3.6  CL 92*  CO2 20*  GLUCOSE 426*  BUN 13  CREATININE 1.27*  CALCIUM 8.2*   GFR: Estimated Creatinine Clearance: 107.3 mL/min (A) (by C-G formula based on SCr of 1.27 mg/dL (H)). Liver Function Tests: Recent Labs  Lab 04/11/18 1025  AST 37  ALT 34  ALKPHOS 57  BILITOT 2.2*  PROT 7.6  ALBUMIN 3.6   No results for input(s): LIPASE, AMYLASE in the last 168 hours. No results for input(s): AMMONIA in the last 168 hours. Coagulation Profile: No results for input(s): INR, PROTIME in the last 168 hours. Cardiac Enzymes: No results for input(s): CKTOTAL, CKMB, CKMBINDEX, TROPONINI in the last 168 hours. BNP (last 3 results) No results for input(s): PROBNP in the last 8760 hours. HbA1C: No results for input(s): HGBA1C in the last 72 hours. CBG: Recent Labs  Lab 04/11/18 0919 04/11/18 1533  04/11/18 1709  GLUCAP 437* 368* 358*   Lipid Profile: No results for input(s): CHOL, HDL, LDLCALC, TRIG, CHOLHDL, LDLDIRECT in the last 72 hours. Thyroid Function Tests: No results for input(s): TSH, T4TOTAL, FREET4, T3FREE, THYROIDAB in the last 72 hours. Anemia Panel: No results for input(s): VITAMINB12, FOLATE, FERRITIN, TIBC, IRON, RETICCTPCT in the last 72 hours. Urine analysis:    Component Value Date/Time   COLORURINE YELLOW 04/11/2018 [place] 04/11/2018 1139   LABSPEC 1.015 04/11/2018 1139   PHURINE 6.0 04/11/2018 1139   GLUCOSEU >=500 (A) 04/11/2018 1139   HGBUR LARGE (A) 04/11/2018 1139   BILIRUBINUR NEGATIVE 04/11/2018 1139   KETONESUR 15 (A) 04/11/2018 1139   PROTEINUR 100 (A) 04/11/2018 1139   NITRITE NEGATIVE 04/11/2018 1139   LEUKOCYTESUR NEGATIVE 04/11/2018 1139   Sepsis Labs: !!!!!!!!!!!!!!!!!!!!!!!!!!!!!!!!!!!!!!!!!!!! @LABRCNTIP (procalcitonin:4,lacticidven:4) ) Recent Results (from the past 240 hour(s))  Blood Culture (routine x 2)  Status: None (Preliminary result)   Collection Time: 04/11/18  9:47 AM  Result Value Ref Range Status   Specimen Description   Final    BLOOD LEFT FOREARM Performed at [facility], [address] [place], [place] [address]    Special Requests   Final    BOTTLES DRAWN AEROBIC AND ANAEROBIC Blood Culture adequate volume Performed at [facility] [address] [place], [place] [address]    Culture PENDING  Incomplete   Report Status PENDING  Incomplete  Rapid Strep Screen ([facility] & [facility] ONLY)     Status: None   Collection Time: 04/11/18  9:56 AM  Result Value Ref Range Status   Streptococcus, Group A Screen (Direct) NEGATIVE NEGATIVE Final    Comment: (NOTE) A Rapid Antigen test may result negative if the antigen level in the sample is below the detection level of this test. The FDA has not cleared this test as a stand-alone test therefore the rapid antigen negative result has  reflexed to a Group A Strep culture. Performed at [facility], [address] [place], [place] [address]     Radiological Exams on Admission: Dg Chest 2 View  Result Date: 04/11/2018 CLINICAL DATA:  Fevers, body aches, fatigue. EXAM: CHEST - 2 VIEW COMPARISON:  No prior. FINDINGS: Mediastinum and hilar structures normal. Prominent left mid lung field infiltrate noted consistent pneumonia. No pleural effusion or pneumothorax. Heart size normal. No acute bony abnormality. IMPRESSION: Prominent left mid lung field infiltrate consistent with pneumonia. Followup PA and lateral chest X-ray is recommended in 3-4 weeks following trial of antibiotic therapy to ensure resolution and exclude underlying malignancy. Electronically Signed   By: Marcello Moores  Register   On: 04/11/2018 11:41   Ct Abdomen Pelvis W Contrast  Result Date: 04/11/2018 CLINICAL DATA:  Two week history of fevers, fatigue, and body aches. EXAM: CT ABDOMEN AND PELVIS WITH CONTRAST TECHNIQUE: Multidetector CT imaging of the abdomen and pelvis was performed using the standard protocol following bolus administration of intravenous contrast. CONTRAST:  162m ISOVUE-300 IOPAMIDOL (ISOVUE-300) INJECTION 61% COMPARISON:  None. FINDINGS: Lower chest: Partially visualized patchy consolidation in the lingula. Hepatobiliary: Diffuse hepatic steatosis. No focal liver abnormality. The gallbladder is decompressed. No biliary dilatation. Pancreas: Unremarkable. No pancreatic ductal dilatation or surrounding inflammatory changes. Spleen: Normal in size without focal abnormality. Adrenals/Urinary Tract: The adrenal glands are unremarkable. Small bilateral renal simple cyst. Subcentimeter low-density lesion in the upper pole of the left kidney is too small to characterize. No renal or ureteral calculi. No hydronephrosis. The bladder is unremarkable. Stomach/Bowel: Stomach is within normal limits. Appendix appears normal. No evidence of bowel wall  thickening, distention, or inflammatory changes. Vascular/Lymphatic: Mild aortic atherosclerosis. 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 No enlarged abdominal or pelvic lymph nodes. Reproductive: Prostate is unremarkable. Other: Small fat containing right inguinal hernia. No free fluid or pneumoperitoneum. Musculoskeletal: No acute or significant osseous findings. Moderate degenerative disc disease at L5-S1. IMPRESSION: 1. Lingular pneumonia. 2.  No acute intra-abdominal process. 3. Diffuse hepatic steatosis. 4. 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 Follow-up CT angiogram of the abdomen in 1-2 years is recommended. This recommendation follows ACR consensus guidelines: White Paper of the ACR Incidental Findings Committee II on Vascular Findings. J Am Coll Radiol [phone] Electronically Signed   By: WTitus DubinM.D.   On: 04/11/2018 11:50  EKG: Independently reviewed. Showed Sinus Tachycardia with a rate of 111 and no appreciable ST elevation or Depression on my interpretation.  Assessment/Plan Active Problems:   Sepsis due to pneumonia ([place])   Lingular pneumonia   Dyspnea   Essential hypertension   Diabetes mellitus type 2 in obese ([facility])   AKI (acute kidney injury) ([place])   Hyponatremia   Hepatic steatosis   Hyperbilirubinemia   Lactic acidosis   Microscopic hematuria   Tobacco abuse  Sepsis 2/2 to Community Acquired Lingular Pneumonia -Admit to inpatient telemetry -On admission patient met sepsis criteria and was febrile with temperature 103.2, a pulse rate of 117, respiratory rate of 32, lactic acid level 2.16 -Sepsis protocol started in the ED and patient was given 2800 mL of Lactated Ringer boluses; will give an additional 1 L normal saline bolus and start the patient on maintenance fluid IV NS 125 mL's per hour -Lactic acid level was 2.16 on  admission and then trended down to 1.53- -Check Procalcitonin -Started empiric antibiotics and patient received IV vancomycin and IV Zosyn however will change to IV ceftriaxone and IV azithromycin given that this is a community-acquired pneumonia -Chest x-ray showed prominent left midlung field infiltrate consistent with pneumonia and CT scan of the abdomen and pelvis showed a lingular consolidation -Continue with duo nebs 3 mils every 6 scheduled and albuterol every 2 as needed -Started Mucinex 1200 g p.o. twice daily, flutter valve, and incentive spirometer -Will not add IV steroids at this time as patient was not wheezing -Blood cultures x2 obtained in the ED and will obtain a sputum culture -Urinalysis was not really consistent with a urinary tract infection but will still send urinary culture -Follow-up culture results and repeat CBC in the a.m. -Check respiratory virus panel and influenza a and B via PCR and place patient on empiric droplet precautions; Influenza A/B Negative so far -Antipyretics with Acetaminophen every 6 as needed  Dyspnea -Likely from above however we will need to rule out a cardiac etiology  -We will cycle patient's cardiac troponins and check an Echocardiogram  Nausea/Vomiting/Diarrhea -? From Viral illness causing Pneumonia -Supportive Care and Antiemetics -C/w IVF Hydration as below and place on Heart Healthy/Carb Modified Diet   Essential Hypertension but currently running softer BP's -Hold patient's home losartan-hydrochlorothiazide combination low blood pressures and AKI -Continue with IV fluid hydration and will give another bolus of 1 L -Continue to monitor vital signs per protocol  Diabetes Mellitus Type 2 -Hold patient's metformin at thousand milligrams p.o. twice daily along with glimepiride 4 mg p.o. Daily -Check hemoglobin A1c -Placed on moderate NovoLog sliding scale AC and at bedtime -Continue to monitor CBGs; CBG's ranging from 358-437 on  admission  Acute Kidney Injury -Unknown baseline at this time -Likely prerenal in the setting of nausea, vomiting, diarrhea, as well as losartan hydrochlorothiazide combination -Avoid nephrotoxic medications and Hold Losartan-HCTZ -Continue with IV fluid hydration with normal saline at rate of 125 mL/per hour -Sepsis protocol was started in the emergency room patient was bolused 2800 mL's of lactated Ringer's; will give an additional 1 L normal saline bolus and start the patient on maintenance fluid as above -Continue to monitor and repeat CMP in a.m.  Hyponatremia/Pseudohyponatremia -In the Setting of hyperglycemia and dehydration from sepsis nausea and vomiting as well as diarrhea -Corrected sodium is 132 -Continue with IV fluid hydration as above -Repeat CMP in a.m.  Diffuse Hepatic Steatosis -On CT of the abdomen and pelvis with contrast -Checked CMP and LFTs  showed AST of 37 and ALT of 34 -Patient states that he drinks alcohol occasionally and does not drink every day and when asked about diet regimen patient states he does not remember think about food at this time given his nausea -Check Right Upper quadrant ultrasound -Repeat CMP in a.m.  Hyperbilirubinemia -Patient's T bili was 2.2 on Admission  -We will obtain right upper quadrant ultrasound -Continue to monitor and trend and repeat CMP in the a.m.  Lactic Acidosis -In the setting of sepsis and metformin -We will hold metformin and continue IV fluid hydration -Lactic acid level improved and went from 2.16 and is now 1.53  Microscopic Hematuria -History of nephrolithiasis per patient -On admission patient's urinalysis showed a clear appearance large hemoglobin on his urine dipstick and 6-10 red blood cells per high-powered field -Current urine culture is pending -We will repeat urinalysis in 48 hours if still persistent will need outpatient follow-up with Urology  Tobacco Abuse -Patient states he has not been smoking  over a month however he has smoked since the age of 59 and trying to quit -Smoking cessation counseling provided   ? Right Renal Artery Aneurysm -Follow up with [facility] in 1-2 years as an outpatient   DVT prophylaxis: Enoxaparin 40 mg sq q24h Code Status: FULL CODE Family Communication: No family present at bedside  Disposition Plan: Anticipate D/C home in next 48-72 Hours Consults called: None Admission status: Inpatient Telemetry  Severity of Illness: The appropriate patient status for this patient is INPATIENT. Inpatient status is judged to be reasonable and necessary in order to provide the required intensity of service to ensure the patient's safety. The patient's presenting symptoms, physical exam findings, and initial radiographic and laboratory data in the context of their chronic comorbidities is felt to place them at high risk for further clinical deterioration. Furthermore, it is not anticipated that the patient will be medically stable for discharge from the hospital within 2 midnights of admission. The following factors support the patient status of inpatient.   " The patient's presenting symptoms include, nausea, vomiting, diarrhea. " The worrisome physical exam findings include diaphoresis as well as dehydration. " The initial radiographic and laboratory data are worrisome because of AKI and because of lingular consolidation. " The chronic co-morbidities include hypertension and diabetes mellitus type 2.  * I certify that at the point of admission it is my clinical judgment that the patient will require inpatient hospital care spanning beyond 2 midnights from the point of admission due to high intensity of service, high risk for further deterioration and high frequency of surveillance required.Kerney Elbe, D.O. [facility] Pager [phone]  If 7PM-7AM, please contact night-coverage [email or web address] Password [facility]  04/11/2018, 5:59 PM

## 2018-04-12 ENCOUNTER — Inpatient Hospital Stay (HOSPITAL_COMMUNITY): Payer: Self-pay

## 2018-04-12 DIAGNOSIS — I5030 Unspecified diastolic (congestive) heart failure: Secondary | ICD-10-CM

## 2018-04-12 LAB — ECHOCARDIOGRAM COMPLETE
Height: 76 in
WEIGHTICAEL: 5120 [oz_av]

## 2018-04-12 LAB — RESPIRATORY PANEL BY PCR
ADENOVIRUS-RVPPCR: NOT DETECTED
Bordetella pertussis: NOT DETECTED
CHLAMYDOPHILA PNEUMONIAE-RVPPCR: NOT DETECTED
CORONAVIRUS HKU1-RVPPCR: NOT DETECTED
CORONAVIRUS NL63-RVPPCR: NOT DETECTED
Coronavirus 229E: NOT DETECTED
Coronavirus OC43: NOT DETECTED
Influenza A: NOT DETECTED
Influenza B: NOT DETECTED
Metapneumovirus: NOT DETECTED
Mycoplasma pneumoniae: NOT DETECTED
Parainfluenza Virus 1: NOT DETECTED
Parainfluenza Virus 2: NOT DETECTED
Parainfluenza Virus 3: NOT DETECTED
Parainfluenza Virus 4: NOT DETECTED
RHINOVIRUS / ENTEROVIRUS - RVPPCR: NOT DETECTED
Respiratory Syncytial Virus: NOT DETECTED

## 2018-04-12 LAB — COMPREHENSIVE METABOLIC PANEL
ALT: 45 U/L (ref 17–63)
ANION GAP: 8 (ref 5–15)
AST: 52 U/L — ABNORMAL HIGH (ref 15–41)
Albumin: 3.1 g/dL — ABNORMAL LOW (ref 3.5–5.0)
Alkaline Phosphatase: 46 U/L (ref 38–126)
BUN: 15 mg/dL (ref 6–20)
CALCIUM: 8.3 mg/dL — AB (ref 8.9–10.3)
CHLORIDE: 98 mmol/L — AB (ref 101–111)
CO2: 26 mmol/L (ref 22–32)
Creatinine, Ser: 1.11 mg/dL (ref 0.61–1.24)
Glucose, Bld: 309 mg/dL — ABNORMAL HIGH (ref 65–99)
Potassium: 4.1 mmol/L (ref 3.5–5.1)
SODIUM: 132 mmol/L — AB (ref 135–145)
Total Bilirubin: 1.7 mg/dL — ABNORMAL HIGH (ref 0.3–1.2)
Total Protein: 6.9 g/dL (ref 6.5–8.1)

## 2018-04-12 LAB — LEGIONELLA PNEUMOPHILA SEROGP 1 UR AG: L. pneumophila Serogp 1 Ur Ag: NEGATIVE

## 2018-04-12 LAB — CBC WITH DIFFERENTIAL/PLATELET
Basophils Absolute: 0 10*3/uL (ref 0.0–0.1)
Basophils Relative: 0 %
EOS ABS: 0 10*3/uL (ref 0.0–0.7)
EOS PCT: 0 %
HCT: 33.4 % — ABNORMAL LOW (ref 39.0–52.0)
Hemoglobin: 11.4 g/dL — ABNORMAL LOW (ref 13.0–17.0)
LYMPHS ABS: 1.4 10*3/uL (ref 0.7–4.0)
Lymphocytes Relative: 19 %
MCH: 30.2 pg (ref 26.0–34.0)
MCHC: 34.1 g/dL (ref 30.0–36.0)
MCV: 88.6 fL (ref 78.0–100.0)
MONO ABS: 0.6 10*3/uL (ref 0.1–1.0)
MONOS PCT: 8 %
Neutro Abs: 5.3 10*3/uL (ref 1.7–7.7)
Neutrophils Relative %: 73 %
PLATELETS: 207 10*3/uL (ref 150–400)
RBC: 3.77 MIL/uL — ABNORMAL LOW (ref 4.22–5.81)
RDW: 12.4 % (ref 11.5–15.5)
WBC: 7.3 10*3/uL (ref 4.0–10.5)

## 2018-04-12 LAB — MAGNESIUM: MAGNESIUM: 1.7 mg/dL (ref 1.7–2.4)

## 2018-04-12 LAB — TROPONIN I

## 2018-04-12 LAB — GLUCOSE, CAPILLARY
GLUCOSE-CAPILLARY: 225 mg/dL — AB (ref 65–99)
Glucose-Capillary: 294 mg/dL — ABNORMAL HIGH (ref 65–99)
Glucose-Capillary: 316 mg/dL — ABNORMAL HIGH (ref 65–99)
Glucose-Capillary: 259 mg/dL — ABNORMAL HIGH (ref 65–99)

## 2018-04-12 LAB — PHOSPHORUS: PHOSPHORUS: 1.7 mg/dL — AB (ref 2.5–4.6)

## 2018-04-12 LAB — HEMOGLOBIN A1C
HEMOGLOBIN A1C: 9.9 % — AB (ref 4.8–5.6)
MEAN PLASMA GLUCOSE: 237.43 mg/dL

## 2018-04-12 LAB — HIV ANTIBODY (ROUTINE TESTING W REFLEX): HIV SCREEN 4TH GENERATION: NONREACTIVE

## 2018-04-12 MED ORDER — INSULIN STARTER KIT- PEN NEEDLES (ENGLISH)
1.0000 | Freq: Once | Status: AC
  Administered 2018-04-12: 1
  Filled 2018-04-12: qty 1

## 2018-04-12 MED ORDER — POTASSIUM PHOSPHATES 15 MMOLE/5ML IV SOLN
20.0000 mmol | Freq: Once | INTRAVENOUS | Status: AC
  Administered 2018-04-12: 20 mmol via INTRAVENOUS
  Filled 2018-04-12: qty 6.67

## 2018-04-12 MED ORDER — ACETAMINOPHEN 325 MG PO TABS
325.0000 mg | ORAL_TABLET | Freq: Once | ORAL | Status: AC
  Administered 2018-04-12: 325 mg via ORAL
  Filled 2018-04-12: qty 1

## 2018-04-12 MED ORDER — LIVING WELL WITH DIABETES BOOK
Freq: Once | Status: AC
  Administered 2018-04-12: 18:00:00
  Filled 2018-04-12: qty 1

## 2018-04-12 MED ORDER — IBUPROFEN 200 MG PO TABS
400.0000 mg | ORAL_TABLET | Freq: Four times a day (QID) | ORAL | Status: DC | PRN
  Administered 2018-04-12 – 2018-04-13 (×3): 400 mg via ORAL
  Filled 2018-04-12 (×4): qty 2

## 2018-04-12 MED ORDER — ACETAMINOPHEN 650 MG RE SUPP: 650.0000 mg | RECTAL | Status: DC | PRN

## 2018-04-12 NOTE — Progress Notes (Signed)
Inpatient Diabetes Program Recommendations  AACE/ADA: New Consensus Statement on Inpatient Glycemic Control (2015)  Target Ranges:  Prepandial:   less than 140 mg/dL      Peak postprandial:   less than 180 mg/dL (1-2 hours)      Critically ill patients:  140 - 180 mg/dL   Lab Results  Component Value Date   GLUCAP 225 (H) 04/12/2018   HGBA1C 9.9 (H) 04/12/2018    Review of Glycemic Control  CBGs today 294, 225   Glucose 309 HgbA1C - 9.9%  Pt states he does not want to go on insulin. Then states if he does, no one will know about it. Stressed importance of monitoring blood sugars and getting PCP who will manage his diabetes. Discussed importance of getting blood sugars in good control, whether with pills and/or insulin.  Educated patient on insulin pen use at home. Reviewed contents of insulin flexpen starter kit. Reviewed all steps if insulin pen including attachment of needle, 2-unit air shot, dialing up dose, giving injection, removing needle, disposal of sharps, storage of unused insulin, disposal of insulin etc.Also reviewed troubleshooting with insulin pen. MD to give patient Rxs for insulin pens and insulin pen needles.  If pt is to be d/ced on insulin, recommend Novolin 70/30 pen at [facility]. Spoke to pt about cost - $44. He voiced understanding. Would start with 70/30 15-20 units bid.  Continue to follow.   Thank you. Lorenda Peck, RD, LDN, CDE Inpatient Diabetes Coordinator [phone]

## 2018-04-12 NOTE — Progress Notes (Signed)
  Echocardiogram 2D Echocardiogram has been performed.  William Marks G [name] 04/12/2018, 9:49 AM

## 2018-04-12 NOTE — Progress Notes (Signed)
Inpatient Diabetes Program Recommendations  AACE/ADA: New Consensus Statement on Inpatient Glycemic Control (2015)  Target Ranges:  Prepandial:   less than 140 mg/dL      Peak postprandial:   less than 180 mg/dL (1-2 hours)      Critically ill patients:  140 - 180 mg/dL   Lab Results  Component Value Date   GLUCAP 225 (H) 04/12/2018   HGBA1C 9.9 (H) 04/12/2018    Review of Glycemic Control  Diabetes history: DM2 Outpatient Diabetes medications: metformin 1000 mg bid, Amaryl 4 mg QAM  Current orders for Inpatient glycemic control: Novolog 0-15 units tidwc and hs  HgbA1C - 9.9% - Will likely need to go home on insulin.   Inpatient Diabetes Program Recommendations:     Add Lantus 25 units Q24H Add Novolog 4 units tidwc for meal coverage insulin Will need to f/u with PCP for management of his diabetes.  Spoke with pt at length regarding his diabetes diagnosis, importance of glucose control at home, how diet, exercise, and stress affect blood sugars. Instructed pt to check blood sugars at least 3x/day and take logbook to MD appt. Discussed HgbA1C of 9.9% goal of 7%. Will order Living Well with Diabetes book and insulin pen starter kit. Pt is without insurance and will need affordable Novolin 70/30 insulin from [facility] (pen cost - $44) Pt states he has not been taking his metformin or Amaryl lately. Stressed importance of taking meds as prescribed. Pt voiced understanding.  Continue to follow.  Thank you. Lorenda Peck, RD, LDN, CDE Inpatient Diabetes Coordinator [phone]

## 2018-04-12 NOTE — Progress Notes (Signed)
PROGRESS NOTE    William Marks  MRN:[id number] DOB: 03/18/1966 DOA: 04/11/2018 PCP: Patient, No Pcp Per   Brief Narrative:  William Marks is a 52 y.o. 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   Patient states symptoms started about 2 weeks ago and about 10 days ago he started having some nausea and vomiting daily and then developed some diarrhea on and off with 2-3 loose and sometimes watery movements a day.  Last bowel movement was day before admission and was nonbloody and emesis is also nonbloody.  Patient also admitted to having some dizziness and states that he developed progressively worsening shortness of breath and states that even when he lays flat  he is short of breath.  Denies any coughing up any sputum or any urinary complaints.  Denies any dysuria but did admit to having some abdominal cramps and pain on the left side of his abdomen.  Patient also complains of a on and off headache with dizziness but denies any chest pain, flank pain, blurred vision or double vision.  Patient states that for the last 4 days he has been trying to call over to come to the hospital but was unable to because of feeling sick and finally was able to and presented to the [facility] where he was transferred to [facility] for admission for sepsis secondary to community-acquired lingular pneumonia.  [facility] was called to admit this patient for the above complaints.  In the ED sepsis protocol was initiated and patient had a chest x-ray as well as a CT of the abdomen and pelvis which revealed patient having a lingular consolidation.  Patient was placed on IV antibiotics and given 2.8 L fluid resuscitation boluses and had basic blood work done.  He was transferred to [facility] [facility] for admission and is currently still being treated for his Left Sided Lingular  Consolidation.   Assessment & Plan:   Active Problems:   Sepsis due to pneumonia (HCC)   Lingular pneumonia   Dyspnea   Essential hypertension   Diabetes mellitus type 2 in obese (HCC)   AKI (acute kidney injury) (HCC)   Hyponatremia   Hepatic steatosis   Hyperbilirubinemia   Lactic acidosis   Microscopic hematuria   Tobacco abuse  Sepsis 2/2 to Community Acquired Lingular Pneumonia -Admit to inpatient telemetry -On admission patient met sepsis criteria and was febrile with temperature 103.2, a pulse rate of 117, respiratory rate of 32, lactic acid level 2.16 -Sepsis protocol started in the ED and patient was given 2800 mL of Lactated Ringer boluses; will give an additional 1 L normal saline bolus and start the patient on maintenance fluid IV NS 125 mL's per hour and now reduced rate to 100 mL/hr  -Lactic acid level was 2.16 on admission and then trended down to 1.53 -Checked Procalcitonin and was 0.97 -Sepsis Physiology improving  -Started empiric antibiotics and patient received IV vancomycin and IV Zosyn however will change to IV Ceftriaxone and IV Azithromycin given that this is a community-acquired pneumonia -Admission Chest x-ray showed prominent left midlung field infiltrate consistent with pneumonia and CT scan of the abdomen and pelvis showed a lingular consolidation -Repeat CXR this AM showed Increased size of the LEFT lower lung pneumonia, now large. -Continue with duo nebs 3 mL every 6 scheduled and albuterol every 2 as needed -Started Mucinex 1200   g p.o. twice daily, flutter valve, and incentive spirometer -Will not add IV steroids at this time as patient was not wheezing -Blood cultures x2 obtained in the ED and will obtain a sputum culture -Blood Cx x2 showed No Growth <24 hours -Sputum Gram Stain showed Rare WBC, Predominantly PMN; Rare Gram Positive Cocci, Rare Gram Positive Rods with Cx Pending -Strep Pneumo Urinary Ag Negative and Legionella Urine Ag pending    -Urinalysis was not really consistent with a urinary tract infection but will still send urinary culture -Follow-up culture results and repeat CBC in the a.m. -Influenza A/B and Respiratory Virus Panel Negative so will D/C Droplet Precautions -Antipyretics with Acetaminophen every 6 as needed and then added po Motrin 400 mg q6hprn for Fever as well  -Discontinue Telemetry   Dyspnea, stable  -Likely from above however we will need to rule out a cardiac etiology  -We will cycle patient's cardiac troponins and check an Echocardiogram -Cardiac Troponin <0.03 x3; ECHOCardiogram showed EF of 60-65% with Grade 1 DD -Repeat CXR showed Worsening PNA  Nausea/Vomiting/Diarrhea, improving  -? From Viral illness causing Pneumonia -Supportive Care and Antiemetics -C/w IVF Hydration as below and place on Heart Healthy/Carb Modified Diet   Essential Hypertension but currently running softer BP's -Hold patient's home losartan-hydrochlorothiazide combination low blood pressures and AKI -Continue with IV fluid hydration as above -Continue to monitor vital signs per protocol  Diabetes Mellitus Type 2 -Hold patient's metformin at thousand milligrams p.o. twice daily along with glimepiride 4 mg p.o. Daily -Checked Hemoglobin A1c and was 9.9 -Placed on moderate NovoLog sliding scale AC and at bedtime -Continue to monitor CBGs; CBG's ranging from 225-352 -Diabetes Education Coordinator Consulted for further evaluation and recommendations  -May need Insulin at Discharge  Acute Kidney Injury but suspecting some form of CKD (Likely Stage 2) -Unknown baseline at this time -Likely prerenal in the setting of nausea, vomiting, diarrhea, as well as losartan hydrochlorothiazide combination -Avoid nephrotoxic medications and Hold Losartan-HCTZ -Continue with IV fluid hydration with normal saline at rate of 100 mL/per hour -BUN/Cr improved from 13/1.27 -> 15/1.11 -Continue to monitor and repeat CMP in  a.m.  Hyponatremia/Pseudohyponatremia, improving -In the Setting of hyperglycemia and dehydration from sepsis nausea and vomiting as well as diarrhea -Corrected sodium was 132 on admission -Now Sodium value is up from 124 and is 132 -Continue with IV fluid hydration as above -Repeat CMP in a.m.  Diffuse Hepatic Steatosis -On CT of the abdomen and pelvis with contrast -Checked CMP and LFTs showed AST of 37 and ALT of 34 on admission but AST is now 52 and LT is now 69 -Patient stated that he drinks alcohol occasionally and does not drink every day; Diet Education given  -Checked Right Upper quadrant ultrasound showed No acute findings. No bile duct dilatation. No evidence of cholecystitis. Fatty infiltration of the liver. - -Repeat CMP in a.m.  Hyperbilirubinemia, improving -Patient's T bili was 2.2 on Admission now trended down to 1.7 -We will obtain right upper quadrant ultrasound -Continue to monitor and trend and repeat CMP in the a.m.  Lactic Acidosis, improved  -In the setting of sepsis and metformin -We will hold metformin and continue IV fluid hydration -Lactic acid level improved and went from 2.16 and is now 1.7  Microscopic Hematuria -History of nephrolithiasis per patient -On admission patient's urinalysis showed a clear appearance large hemoglobin on his urine dipstick and 6-10 red blood cells per high-powered field -Current urine culture is pending to be sent -We  will repeat urinalysis tomorrow and if still persistent will need outpatient follow-up with Urology  Tobacco Abuse -Patient states he has not been smoking over a month however he has smoked since the age of 18 and trying to quit -Smoking cessation counseling provided   ? Right Renal Artery Aneurysm -Follow up with [facility] in 1-2 years as an outpatient   Hypophosphatemia -Patient's Phos Level this AM was 1.7 -Replete with IV potassium phosphate 20 mmol -Continue to monitor and replete as  necessary -Repeat phosphorus level in a.m.  DVT prophylaxis: Enoxaparin 40 mg sq q24h Code Status: FULL CODE Family Communication: No family present at bedside  Disposition Plan: Remain Inpatient for continued Workup and Treatment   Consultants:   None   Procedures:  ECHOCARDIOGRAM ------------------------------------------------------------------- Study Conclusions  - Left ventricle: The cavity size was normal. Wall thickness was   increased in a pattern of mild LVH. Systolic function was normal.   The estimated ejection fraction was in the range of 60% to 65%.   Wall motion was normal; there were no regional wall motion   abnormalities. Doppler parameters are consistent with abnormal   left ventricular relaxation (grade 1 diastolic dysfunction). - Left atrium: The atrium was mildly dilated.  Antimicrobials:  Anti-infectives (From admission, onward)   Start     Dose/Rate Route Frequency Ordered Stop   04/11/18 2000  cefTRIAXone (ROCEPHIN) 1 g in sodium chloride 0.9 % 100 mL IVPB     1 g 200 mL/hr over 30 Minutes Intravenous Every 24 hours 04/11/18 1706 04/18/18 1959   04/11/18 1830  piperacillin-tazobactam (ZOSYN) IVPB 3.375 g  Status:  Discontinued     3.375 g 12.5 mL/hr over 240 Minutes Intravenous Every 8 hours 04/11/18 0950 04/11/18 1706   04/11/18 1800  azithromycin (ZITHROMAX) 500 mg in sodium chloride 0.9 % 250 mL IVPB     500 mg 250 mL/hr over 60 Minutes Intravenous Every 24 hours 04/11/18 1706 04/18/18 1759   04/11/18 1130  vancomycin (VANCOCIN) IVPB 1000 mg/200 mL premix     1,000 mg 200 mL/hr over 60 Minutes Intravenous  Once 04/11/18 0948 04/11/18 1242   04/11/18 1000  vancomycin (VANCOCIN) IVPB 1000 mg/200 mL premix     1,000 mg 200 mL/hr over 60 Minutes Intravenous  Once 04/11/18 0948 04/11/18 1131   04/11/18 0945  piperacillin-tazobactam (ZOSYN) IVPB 3.375 g     3.375 g 100 mL/hr over 30 Minutes Intravenous  Once 04/11/18 0937 04/11/18 1007   04/11/18  0945  vancomycin (VANCOCIN) 2,000 mg in sodium chloride 0.9 % 500 mL IVPB  Status:  Discontinued     2,000 mg 250 mL/hr over 120 Minutes Intravenous  Once 04/11/18 0941 04/11/18 0947     Subjective: Seen and examined at bedside and still feeling not well.  Denies any chest pain however did still have some shortness of breath and is coughing up more mucus.  No lightheadedness or dizziness but did still have a slight headache.  Continues to have fevers and did not really want to eat anything.  No other concerns or complaints at this time.  Objective: Vitals:   04/11/18 1830 04/11/18 1952 04/11/18 2106 04/12/18 0346  BP: (!) 119/26 132/71  (!) 124/56  Pulse: 88 100  91  Resp: 17 20  20  Temp: 100.2 F (37.9 C) (!) 103.2 F (39.6 C) (!) 103.1 F (39.5 C) (!) 102.9 F (39.4 C)  TempSrc: Oral Oral Oral Oral  SpO2: 98% 100%  99%  Weight:        Height:        Intake/Output Summary (Last 24 hours) at 04/12/2018 1338 Last data filed at 04/12/2018 1232 Gross per 24 hour  Intake 2582.79 ml  Output 2000 ml  Net 582.79 ml   Filed Weights   04/11/18 0918  Weight: (!) 145.2 kg (320 lb)   Examination: Physical Exam:  Constitutional: WN/WD obese AAM NAD and again appears not well Eyes: Lids and conjunctivae normal, sclerae anicteric  ENMT: External Ears, Nose appear normal. Grossly normal hearing.  Neck: Appears normal, supple, no cervical masses, normal ROM, no appreciable thyromegaly, no JVD Respiratory: Diminished to auscultation bilaterally, no wheezing, rales, rhonchi or crackles. Normal respiratory effort and patient is not tachypenic. No accessory muscle use. Not wearing supplemental O2 via [place] Cardiovascular: RRR, no murmurs / rubs / gallops. S1 and S2 auscultated. No extremity edema. 2+ pedal pulses. No carotid bruits.  Abdomen: Soft, slightly tender, Distended due to body habitus. No masses palpated. No appreciable hepatosplenomegaly. Bowel sounds positive x4.  GU:  Deferred. Musculoskeletal: No clubbing / cyanosis of digits/nails. No joint deformity upper and lower extremities Skin: No rashes, lesions, ulcers on a limited skin evaluation. No induration; Warm and dry.  Neurologic: CN 2-12 grossly intact with no focal deficits.  Romberg sign and cerebellar reflexes not assessed.  Psychiatric: Normal judgment and insight. Alert and oriented x 3. Normal mood and appropriate affect.   Data Reviewed: I have personally reviewed following labs and imaging studies  CBC: Recent Labs  Lab 04/11/18 0947 04/11/18 1717 04/12/18 0541  WBC 10.2 8.7 7.3  NEUTROABS 7.5  --  5.3  HGB 13.3 12.0* 11.4*  HCT 38.0* 34.9* 33.4*  MCV 86.4 87.0 88.6  PLT 224 229 235   Basic Metabolic Panel: Recent Labs  Lab 04/11/18 1025 04/11/18 1717 04/12/18 0541  NA 124*  --  132*  K 3.6  --  4.1  CL 92*  --  98*  CO2 20*  --  26  GLUCOSE 426*  --  309*  BUN 13  --  15  CREATININE 1.27* 1.29* 1.11  CALCIUM 8.2*  --  8.3*  MG  --   --  1.7  PHOS  --   --  1.7*   GFR: Estimated Creatinine Clearance: 122.7 mL/min (by C-G formula based on SCr of 1.11 mg/dL). Liver Function Tests: Recent Labs  Lab 04/11/18 1025 04/12/18 0541  AST 37 52*  ALT 34 45  ALKPHOS 57 46  BILITOT 2.2* 1.7*  PROT 7.6 6.9  ALBUMIN 3.6 3.1*   No results for input(s): LIPASE, AMYLASE in the last 168 hours. No results for input(s): AMMONIA in the last 168 hours. Coagulation Profile: No results for input(s): INR, PROTIME in the last 168 hours. Cardiac Enzymes: Recent Labs  Lab 04/11/18 1717 04/11/18 2250 04/12/18 0541  TROPONINI <0.03 <0.03 <0.03   BNP (last 3 results) No results for input(s): PROBNP in the last 8760 hours. HbA1C: Recent Labs    04/12/18 0541  HGBA1C 9.9*   CBG: Recent Labs  Lab 04/11/18 1533 04/11/18 1709 04/11/18 2103 04/12/18 0715 04/12/18 1143  GLUCAP 368* 358* 352* 294* 225*   Lipid Profile: No results for input(s): CHOL, HDL, LDLCALC, TRIG,  CHOLHDL, LDLDIRECT in the last 72 hours. Thyroid Function Tests: Recent Labs    04/11/18 1717  TSH 1.612   Anemia Panel: No results for input(s): VITAMINB12, FOLATE, FERRITIN, TIBC, IRON, RETICCTPCT in the last 72 hours. Sepsis Labs: Recent Labs  Lab 04/11/18 5732 04/11/18 1151  04/11/18 1717 04/11/18 1946  PROCALCITON  --   --  0.87  --   LATICACIDVEN 2.16* 1.53 1.4 1.7    Recent Results (from the past 240 hour(s))  Blood Culture (routine x 2)     Status: None (Preliminary result)   Collection Time: 04/11/18  9:47 AM  Result Value Ref Range Status   Specimen Description   Final    BLOOD LEFT FOREARM Performed at [facility], [address] [place], [place] [address]    Special Requests   Final    BOTTLES DRAWN AEROBIC AND ANAEROBIC Blood Culture adequate volume   Culture   Final    NO GROWTH < 24 HOURS Performed at [facility], [place] [address], [place], [place] [address]    Report Status PENDING  Incomplete  Rapid Strep Screen ([facility] ONLY)     Status: None   Collection Time: 04/11/18  9:56 AM  Result Value Ref Range Status   Streptococcus, Group A Screen (Direct) NEGATIVE NEGATIVE Final    Comment: (NOTE) A Rapid Antigen test may result negative if the antigen level in the sample is below the detection level of this test. The FDA has not cleared this test as a stand-alone test therefore the rapid antigen negative result has reflexed to a Group A Strep culture. Performed at [facility], [address] [place], [place] [address]   Culture, group A strep     Status: None (Preliminary result)   Collection Time: 04/11/18  9:56 AM  Result Value Ref Range Status   Specimen Description   Final    THROAT Performed at [facility], [address] [place], [place] [address]    Special Requests   Final    NONE Reflexed from [id number] Performed at [facility], [address] [place], [place] [address]    Culture    Final    CULTURE REINCUBATED FOR BETTER GROWTH Performed at [facility], [place] [address], [place], [place] [address]    Report Status PENDING  Incomplete  Blood Culture (routine x 2)     Status: None (Preliminary result)   Collection Time: 04/11/18 10:00 AM  Result Value Ref Range Status   Specimen Description   Final    BLOOD RIGHT ARM Performed at [facility], [address] [place], [place] [address]    Special Requests   Final    BOTTLES DRAWN AEROBIC AND ANAEROBIC Blood Culture adequate volume Performed at [facility], [address] [place], [place] [address]    Culture   Final    NO GROWTH < 24 HOURS Performed at [facility], [place] [address] [place], [place] [address]    Report Status PENDING  Incomplete  Respiratory Panel by PCR     Status: None   Collection Time: 04/11/18  5:29 PM  Result Value Ref Range Status   Adenovirus NOT DETECTED NOT DETECTED Final   Coronavirus 229E NOT DETECTED NOT DETECTED Final   Coronavirus HKU1 NOT DETECTED NOT DETECTED Final   Coronavirus NL63 NOT DETECTED NOT DETECTED Final   Coronavirus OC43 NOT DETECTED NOT DETECTED Final   Metapneumovirus NOT DETECTED NOT DETECTED Final   Rhinovirus / Enterovirus NOT DETECTED NOT DETECTED Final   Influenza A NOT DETECTED NOT DETECTED Final   Influenza B NOT DETECTED NOT DETECTED Final   Parainfluenza Virus 1 NOT  DETECTED NOT DETECTED Final   Parainfluenza Virus 2 NOT DETECTED NOT DETECTED Final   Parainfluenza Virus 3 NOT DETECTED NOT DETECTED Final   Parainfluenza Virus 4 NOT DETECTED NOT DETECTED Final   Respiratory Syncytial Virus NOT DETECTED NOT DETECTED Final   Bordetella pertussis NOT DETECTED NOT DETECTED Final   Chlamydophila pneumoniae NOT DETECTED NOT DETECTED Final   Mycoplasma pneumoniae NOT DETECTED NOT DETECTED Final    Comment: Performed at [facility], [address] [place], [place] [address]  Culture, sputum-assessment     Status:  None   Collection Time: 04/11/18  7:54 PM  Result Value Ref Range Status   Specimen Description EXPECTORATED SPUTUM  Final   Special Requests NONE  Final   Sputum evaluation   Final    THIS SPECIMEN IS ACCEPTABLE FOR SPUTUM CULTURE Performed at [facility], [address] [place], [place] [address]    Report Status 04/11/2018 FINAL  Final  Culture, respiratory (NON-Expectorated)     Status: None (Preliminary result)   Collection Time: 04/11/18  7:54 PM  Result Value Ref Range Status   Specimen Description   Final    EXPECTORATED SPUTUM Performed at [facility], [address] [place], [place] [address]    Special Requests   Final    NONE Reflexed from [id number] Performed at  [facility], [address] [place], [place] [address]    Gram Stain   Final    RARE WBC PRESENT, PREDOMINANTLY PMN RARE GRAM POSITIVE COCCI RARE GRAM POSITIVE RODS    Culture   Final    CULTURE REINCUBATED FOR BETTER GROWTH Performed at [facility], [address] [place], [place] [address]    Report Status PENDING  Incomplete    Radiology Studies: Dg Chest 2 View  Result Date: 04/12/2018 CLINICAL DATA:  Worsening cough, sob, mid chest, mid abd pain this a.m, EXAM: CHEST - 2 VIEW COMPARISON:  Chest x-ray dated 04/11/2018. FINDINGS: Increased size of the LEFT lower lung pneumonia. RIGHT lung remains clear. Heart size and mediastinal contours are stable. IMPRESSION: Increased size of the LEFT lower lung pneumonia, now large. Electronically Signed   By: Stan  Maynard M.D.   On: 04/12/2018 10:38   Dg Chest 2 View  Result Date: 04/11/2018 CLINICAL DATA:  Fevers, body aches, fatigue. EXAM: CHEST - 2 VIEW COMPARISON:  No prior. FINDINGS: Mediastinum and hilar structures normal. Prominent left mid lung field infiltrate noted consistent pneumonia. No pleural effusion or pneumothorax. Heart size normal. No acute bony abnormality. IMPRESSION:  Prominent left mid lung field infiltrate consistent with pneumonia. Followup PA and lateral chest X-ray is recommended in 3-4 weeks following trial of antibiotic therapy to ensure resolution and exclude underlying malignancy. Electronically Signed   By: Thomas  Register   On: 04/11/2018 11:41   Ct Abdomen Pelvis W Contrast  Result Date: 04/11/2018 CLINICAL DATA:  Two week history of fevers, fatigue, and body aches. EXAM: CT ABDOMEN AND PELVIS WITH CONTRAST TECHNIQUE: Multidetector CT imaging of the abdomen and pelvis was performed using the standard protocol following bolus administration of intravenous contrast. CONTRAST:  100mL ISOVUE-300 IOPAMIDOL (ISOVUE-300) INJECTION 61% COMPARISON:  None. FINDINGS: Lower chest: Partially visualized patchy consolidation in the lingula. Hepatobiliary: Diffuse hepatic steatosis. No focal liver abnormality. The gallbladder is decompressed. No biliary dilatation. Pancreas: Unremarkable. No pancreatic ductal dilatation or surrounding inflammatory changes. Spleen: Normal in size without focal abnormality. Adrenals/Urinary Tract: The adrenal glands are unremarkable. Small bilateral   renal simple cyst. Subcentimeter low-density lesion in the upper pole of the left kidney is too small to characterize. No renal or ureteral calculi. No hydronephrosis. The bladder is unremarkable. Stomach/Bowel: Stomach is within normal limits. Appendix appears normal. No evidence of bowel wall thickening, distention, or inflammatory changes. Vascular/Lymphatic: Mild aortic atherosclerosis. 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 No enlarged abdominal or pelvic lymph nodes. Reproductive: Prostate is unremarkable. Other: Small fat containing right inguinal hernia. No free fluid or pneumoperitoneum. Musculoskeletal: No acute or significant osseous findings. Moderate degenerative disc disease at L5-S1. IMPRESSION: 1.  Lingular pneumonia. 2.  No acute intra-abdominal process. 3. Diffuse hepatic steatosis. 4. 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 Follow-up CT angiogram of the abdomen in 1-2 years is recommended. This recommendation follows ACR consensus guidelines: White Paper of the ACR Incidental Findings Committee II on Vascular Findings. J Am Coll Radiol 2013:10:789-794. Electronically Signed   By: William T Derry M.D.   On: 04/11/2018 11:50   Us Abdomen Limited Ruq  Result Date: 04/12/2018 CLINICAL DATA:  Hyperbilirubinemia EXAM: ULTRASOUND ABDOMEN LIMITED RIGHT UPPER QUADRANT COMPARISON:  None. FINDINGS: Gallbladder: Contracted. No gallstones or wall thickening visualized. No sonographic Murphy sign noted by sonographer. Common bile duct: Diameter: 5 mm Liver: No focal lesion identified. Liver is diffusely echogenic indicating fatty infiltration. Portal vein is patent on color Doppler imaging with normal direction of blood flow towards the liver. IMPRESSION: 1. No acute findings. No bile duct dilatation. No evidence of cholecystitis. 2. Fatty infiltration of the liver. Electronically Signed   By: Stan  Maynard M.D.   On: 04/12/2018 11:13   Scheduled Meds: . chlorhexidine  15 mL Mouth Rinse BID  . enoxaparin (LOVENOX) injection  40 mg Subcutaneous Q24H  . guaiFENesin  1,200 mg Oral BID  . insulin aspart  0-15 Units Subcutaneous TID WC  . insulin aspart  0-5 Units Subcutaneous QHS  . mouth rinse  15 mL Mouth Rinse q12n4p   Continuous Infusions: . sodium chloride 100 mL/hr at 04/12/18 1057  . azithromycin Stopped (04/11/18 1939)  . cefTRIAXone (ROCEPHIN)  IV Stopped (04/11/18 2054)  . potassium PHOSPHATE IVPB (in mmol) 20 mmol (04/12/18 0823)    LOS: 1 day   Omair Latif Sheikh, DO [facility] Pager [phone]  If 7PM-7AM, please contact night-coverage [email or web address] Password [email or web address] 04/12/2018, 1:38 PM  

## 2018-04-13 ENCOUNTER — Inpatient Hospital Stay (HOSPITAL_COMMUNITY): Payer: Self-pay

## 2018-04-13 LAB — COMPREHENSIVE METABOLIC PANEL
ALBUMIN: 2.9 g/dL — AB (ref 3.5–5.0)
ALBUMIN: 2.8 g/dL — AB (ref 3.5–5.0)
ALK PHOS: 51 U/L (ref 38–126)
ALK PHOS: 62 U/L (ref 38–126)
ALT: 82 U/L — ABNORMAL HIGH (ref 17–63)
ALT: 103 U/L — ABNORMAL HIGH (ref 17–63)
ANION GAP: 8 (ref 5–15)
AST: 96 U/L — AB (ref 15–41)
AST: 114 U/L — ABNORMAL HIGH (ref 15–41)
Anion gap: 9 (ref 5–15)
BILIRUBIN TOTAL: 0.9 mg/dL (ref 0.3–1.2)
BUN: 15 mg/dL (ref 6–20)
BUN: 17 mg/dL (ref 6–20)
CALCIUM: 8.3 mg/dL — AB (ref 8.9–10.3)
CHLORIDE: 100 mmol/L — AB (ref 101–111)
CO2: 26 mmol/L (ref 22–32)
CO2: 24 mmol/L (ref 22–32)
CREATININE: 1 mg/dL (ref 0.61–1.24)
Calcium: 8.5 mg/dL — ABNORMAL LOW (ref 8.9–10.3)
Chloride: 99 mmol/L — ABNORMAL LOW (ref 101–111)
Creatinine, Ser: 0.99 mg/dL (ref 0.61–1.24)
GFR calc Af Amer: >60 mL/min (ref >60)
GFR calc Af Amer: >60 mL/min (ref >60)
GFR calc non Af Amer: >60 mL/min (ref >60)
GFR calc non Af Amer: >60 mL/min (ref >60)
GLUCOSE: 267 mg/dL — AB (ref 65–99)
Glucose, Bld: 351 mg/dL — ABNORMAL HIGH (ref 65–99)
POTASSIUM: 3.8 mmol/L (ref 3.5–5.1)
POTASSIUM: 4.1 mmol/L (ref 3.5–5.1)
SODIUM: 132 mmol/L — AB (ref 135–145)
Sodium: 134 mmol/L — ABNORMAL LOW (ref 135–145)
Total Bilirubin: 1.4 mg/dL — ABNORMAL HIGH (ref 0.3–1.2)
Total Protein: 6.6 g/dL (ref 6.5–8.1)
Total Protein: 6.7 g/dL (ref 6.5–8.1)

## 2018-04-13 LAB — URINALYSIS, ROUTINE W REFLEX MICROSCOPIC
BACTERIA UA: NONE SEEN
Bilirubin Urine: NEGATIVE
Glucose, UA: >=500 mg/dL — AB
Ketones, ur: 5 mg/dL — AB
LEUKOCYTES UA: NEGATIVE
Nitrite: NEGATIVE
PROTEIN: NEGATIVE mg/dL
Specific Gravity, Urine: 1.025 (ref 1.005–1.030)
pH: 6 (ref 5.0–8.0)

## 2018-04-13 LAB — MAGNESIUM: MAGNESIUM: 1.8 mg/dL (ref 1.7–2.4)

## 2018-04-13 LAB — CBC WITH DIFFERENTIAL/PLATELET
BASOS ABS: 0 10*3/uL (ref 0.0–0.1)
BASOS PCT: 0 %
Eosinophils Absolute: 0.1 10*3/uL (ref 0.0–0.7)
Eosinophils Relative: 1 %
HEMATOCRIT: 33 % — AB (ref 39.0–52.0)
HEMOGLOBIN: 11.1 g/dL — AB (ref 13.0–17.0)
LYMPHS PCT: 23 %
Lymphs Abs: 1 10*3/uL (ref 0.7–4.0)
MCH: 29.9 pg (ref 26.0–34.0)
MCHC: 33.6 g/dL (ref 30.0–36.0)
MCV: 88.9 fL (ref 78.0–100.0)
Monocytes Absolute: 0.4 10*3/uL (ref 0.1–1.0)
Monocytes Relative: 11 %
NEUTROS ABS: 2.7 10*3/uL (ref 1.7–7.7)
Neutrophils Relative %: 65 %
Platelets: 210 10*3/uL (ref 150–400)
RBC: 3.71 MIL/uL — ABNORMAL LOW (ref 4.22–5.81)
RDW: 12.5 % (ref 11.5–15.5)
WBC: 4.2 10*3/uL (ref 4.0–10.5)

## 2018-04-13 LAB — CULTURE, GROUP A STREP (THRC)

## 2018-04-13 LAB — GLUCOSE, CAPILLARY
GLUCOSE-CAPILLARY: 374 mg/dL — AB (ref 65–99)
GLUCOSE-CAPILLARY: 435 mg/dL — AB (ref 65–99)
Glucose-Capillary: 258 mg/dL — ABNORMAL HIGH (ref 65–99)
Glucose-Capillary: 272 mg/dL — ABNORMAL HIGH (ref 65–99)

## 2018-04-13 LAB — PHOSPHORUS: Phosphorus: 2.6 mg/dL (ref 2.5–4.6)

## 2018-04-13 MED ORDER — SODIUM CHLORIDE 3 % IN NEBU
4.0000 mL | INHALATION_SOLUTION | Freq: Every day | RESPIRATORY_TRACT | Status: AC
  Administered 2018-04-13: 4 mL via RESPIRATORY_TRACT
  Filled 2018-04-13 (×3): qty 4

## 2018-04-13 MED ORDER — INSULIN ASPART 100 UNIT/ML ~~LOC~~ SOLN
4.0000 [IU] | Freq: Three times a day (TID) | SUBCUTANEOUS | Status: DC
  Administered 2018-04-13 – 2018-04-14 (×2): 4 [IU] via SUBCUTANEOUS
  Administered 2018-04-14: 7 [IU] via SUBCUTANEOUS
  Administered 2018-04-14 – 2018-04-15 (×3): 4 [IU] via SUBCUTANEOUS

## 2018-04-13 MED ORDER — INSULIN ASPART 100 UNIT/ML ~~LOC~~ SOLN
10.0000 [IU] | Freq: Once | SUBCUTANEOUS | Status: AC
  Administered 2018-04-13: 10 [IU] via SUBCUTANEOUS

## 2018-04-13 MED ORDER — LIP MEDEX EX OINT
TOPICAL_OINTMENT | CUTANEOUS | Status: AC
  Administered 2018-04-13: 13:00:00
  Filled 2018-04-13: qty 7

## 2018-04-13 MED ORDER — METHYLPREDNISOLONE SODIUM SUCC 125 MG IJ SOLR
60.0000 mg | Freq: Two times a day (BID) | INTRAMUSCULAR | Status: DC
  Administered 2018-04-13 – 2018-04-14 (×3): 60 mg via INTRAVENOUS
  Filled 2018-04-13 (×3): qty 2

## 2018-04-13 MED ORDER — INSULIN GLARGINE 100 UNIT/ML ~~LOC~~ SOLN
25.0000 [IU] | Freq: Every day | SUBCUTANEOUS | Status: DC
  Administered 2018-04-13 – 2018-04-14 (×2): 25 [IU] via SUBCUTANEOUS
  Filled 2018-04-13 (×3): qty 0.25

## 2018-04-13 NOTE — Progress Notes (Deleted)
Pt. daughter Luster LandsbergRenee called to check on pt. , she was very upset and yelling that he received a Xanax today for anxiety/agitation. The Xanax given earlier was effective in treating his agitation. She said that she was to be called prior to him getting any Xanax ([facility] is [facility] of [facility] the [facility]) and requested the order be discontinued. MD notified and new order received.

## 2018-04-13 NOTE — Progress Notes (Signed)
PROGRESS NOTE    William Marks  JOI:[id number] DOB: [birth year] DOA: 04/11/2018 PCP: Patient, No Pcp Per   Brief Narrative:  William Marks is a 52 y.o. 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   Patient states symptoms started about 2 weeks ago and about 10 days ago he started having some nausea and vomiting daily and then developed some diarrhea on and off with 2-3 loose and sometimes watery movements a day.  Last bowel movement was day before admission and was nonbloody and emesis is also nonbloody.  Patient also admitted to having some dizziness and states that he developed progressively worsening shortness of breath and states that even when he lays flat  he is short of breath.  Denies any coughing up any sputum or any urinary complaints.  Denies any dysuria but did admit to having some abdominal cramps and pain on the left side of his abdomen.  Patient also complains of a on and off headache with dizziness but denies any chest pain, flank pain, blurred vision or double vision.  Patient states that for the last 4 days he has been trying to call over to come to the hospital but was unable to because of feeling sick and finally was able to and presented to the [facility] where he was transferred to [facility] long hospital for admission for sepsis secondary to community-acquired lingular pneumonia.  [facility] was called to admit this patient for the above complaints.  In the ED sepsis protocol was initiated and patient had a chest x-ray as well as a CT of the abdomen and pelvis which revealed patient having a lingular consolidation.  Patient was placed on IV antibiotics and given 2.8 L fluid resuscitation boluses and had basic blood work done.  He was transferred to [facility] for admission and is currently still being treated for his Left Sided Lingular  Consolidation. Patient was not improving significantly so IV Steroids were started along with Hypertonic Saline Nebs  Assessment & Plan:   Active Problems:   Sepsis due to pneumonia ([place])   Lingular pneumonia   Dyspnea   Essential hypertension   Diabetes mellitus type 2 in obese (HCC)   AKI (acute kidney injury) ([place])   Hyponatremia   Hepatic steatosis   Hyperbilirubinemia   Lactic acidosis   Microscopic hematuria   Tobacco abuse  Sepsis 2/2 to Community Acquired Lingular Pneumonia -Admit to inpatient telemetry -On admission patient met sepsis criteria and was febrile with temperature 103.2, a pulse rate of 117, respiratory rate of 32, lactic acid level 2.16 -Sepsis protocol started in the ED and patient was given 2800 mL of Lactated Ringer boluses; will give an additional 1 L normal saline bolus and start the patient on maintenance fluid IV NS 125 mL's per hour and now reduced rate to 100 mL/hr yesterday and will reduce it to 75 mL/hr today  -Lactic acid level was 2.16 on admission and then trended down to 1.53 -Checked Procalcitonin and was 0.97 -Sepsis Physiology improving however patient continues to spike temperatures  -Started empiric antibiotics and patient received IV vancomycin and IV Zosyn however will change to IV Ceftriaxone and IV Azithromycin given that this is a community-acquired pneumonia -Admission Chest x-ray showed prominent left midlung field infiltrate consistent with pneumonia and CT scan of the abdomen and pelvis showed a lingular consolidation -Repeat CXR yesterday AM  showed Increased size of the LEFT lower lung pneumonia, now large and today's CXR was unchanged  -Continue with duo nebs 3 mL every 6 scheduled and albuterol every 2 as needed -Will add Hypertonic Saline Nebs to regimen -Started Mucinex 1200 g p.o. twice daily, flutter valve, and incentive spirometer -Added IV Steroids with Solumedrol 60 mg q12 as patient was not significantly improving  -Blood  cultures x2 obtained in the ED and will obtain a sputum culture -Blood Cx x2 showed No Growth <24 hours still -Sputum Gram Stain showed Rare WBC, Predominantly PMN; Rare Gram Positive Cocci, Rare Gram Positive Rods with Cx Pending -Strep Pneumo Urinary Ag Negative and Legionella Urine Ag pending  -Urinalysis was not really consistent with a urinary tract infection but will still send urinary culture -Follow-up culture results and repeat CBC in the a.m. -Influenza A/B and Respiratory Virus Panel Negative so D/C'd Droplet Precautions -Antipyretics with Acetaminophen every 6 as needed and then added po Motrin 400 mg q6hprn for Fever as well  -Discontinued Telemetry as patient was not able to keep it on -Patient felt dyspneic so was started on Supplemental O2 via Omro for comfort even though he was saturating 99% on Room Air   -If not improving by tomorrow will Discuss Case with Pulmonology for the need for a formal consultation   Dyspnea -Likely from above however needed to rule out a cardiac etiology  -We will cycle patient's cardiac troponins and check an Echocardiogram -Cardiac Troponin <0.03 x3; ECHOCardiogram showed EF of 60-65% with Grade 1 DD -Repeat CXR yesterday showed Worsening PNA and was unchanged today  -As above  Nausea/Vomiting/Diarrhea, improved  -? From Viral illness causing Pneumonia -Supportive Care and Antiemetics -C/w IVF Hydration as above and placed on Heart Healthy/Carb Modified Diet   Essential Hypertension but currently running softer BP's -Hold patient's home losartan-hydrochlorothiazide combination low blood pressures and AKI -Continue with IV fluid hydration as above -Continue to monitor vital signs per protocol  Diabetes Mellitus Type 2 -Hold patient's metformin at thousand milligrams p.o. twice daily along with glimepiride 4 mg p.o. Daily -Checked Hemoglobin A1c and was 9.9 -Placed on moderate NovoLog sliding scale AC and at bedtime and also started  Lantus 25 units sq Daily at the recommendation of the Diabetes Education Coordinator along with 4 units of Novolog TIDwm -Continue to monitor CBGs; CBG's ranging from 258-316 -Diabetes Education Coordinator Consulted for further evaluation and recommendations  -Will need Insulin at Discharge and Diabetes Education Coordinator recommending Novolin 70/30 15-20 units BID   Acute Kidney Injury but suspecting some form of CKD (Likely Stage 2) -Unknown baseline at this time -Likely prerenal in the setting of nausea, vomiting, diarrhea, as well as losartan hydrochlorothiazide combination -Avoid nephrotoxic medications and Hold Losartan-HCTZ -Continue with IV fluid hydration with normal saline at rate of 75 mL/per hour -BUN/Cr improved from 13/1.27 -> 15/1.11 -> 15/1.00 -Continue to monitor and repeat CMP in a.m.  Hyponatremia/Pseudohyponatremia, improving -In the Setting of hyperglycemia and dehydration from sepsis nausea and vomiting as well as diarrhea -Corrected sodium was 132 on admission -Now Sodium value is up from 124 and is 134 -Continue with IV fluid hydration as above -Repeat CMP in a.m.  Diffuse Hepatic Steatosis -On CT of the abdomen and pelvis with contrast -Checked CMP and LFTs showed AST of 37 and ALT of 34 on admission but AST is now 96 and LT is now 56 -Patient stated that he drinks alcohol occasionally and does not drink every day; Diet Education given  -  Checked Right Upper quadrant ultrasound showed No acute findings. No bile duct dilatation. No evidence of cholecystitis. Fatty infiltration of the liver. -Check Acute Hepatitis Panel  -Repeat CMP in a.m.  Hyperbilirubinemia, improved -Patient's T bili was 2.2 on Admission now trended down to 0.9 -Right upper quadrant ultrasound as above -Continue to monitor and trend and repeat CMP in the a.m.  Lactic Acidosis, improved  -In the setting of sepsis and metformin -We will hold metformin and continue IV fluid  hydration -Lactic acid level improved and went from 2.16 and is now 1.7  Microscopic Hematuria -History of nephrolithiasis per patient -On admission patient's urinalysis showed a clear appearance large hemoglobin on his urine dipstick and 6-10 red blood cells per high-powered field -Current urine culture is pending to be sent -We will repeat urinalysis today and if still persistent will need outpatient follow-up with Urology  Tobacco Abuse -Patient states he has not been smoking over a month however he has smoked since the age of 81 and trying to quit -Smoking cessation counseling provided   ? Right Renal Artery Aneurysm -Follow up with [facility]A in 1-2 years as an outpatient   Hypophosphatemia -Patient's Phos Level this AM was 1.7 -Replete with IV potassium phosphate 20 mmol -Continue to monitor and replete as necessary -Repeat phosphorus level in a.m.  DVT prophylaxis: Enoxaparin 40 mg sq q24h Code Status: FULL CODE Family Communication: No family present at bedside  Disposition Plan: Remain Inpatient for continued Workup and Treatment for PNA  Consultants:   None   Procedures:  ECHOCARDIOGRAM ------------------------------------------------------------------- Study Conclusions  - Left ventricle: The cavity size was normal. Wall thickness was   increased in a pattern of mild LVH. Systolic function was normal.   The estimated ejection fraction was in the range of 60% to 65%.   Wall motion was normal; there were no regional wall motion   abnormalities. Doppler parameters are consistent with abnormal   left ventricular relaxation (grade 1 diastolic dysfunction). - Left atrium: The atrium was mildly dilated.  Antimicrobials:  Anti-infectives (From admission, onward)   Start     Dose/Rate Route Frequency Ordered Stop   04/11/18 2000  cefTRIAXone (ROCEPHIN) 1 g in sodium chloride 0.9 % 100 mL IVPB     1 g 200 mL/hr over 30 Minutes Intravenous Every 24 hours 04/11/18 1706  04/18/18 1959   04/11/18 1830  piperacillin-tazobactam (ZOSYN) IVPB 3.375 g  Status:  Discontinued     3.375 g 12.5 mL/hr over 240 Minutes Intravenous Every 8 hours 04/11/18 0950 04/11/18 1706   04/11/18 1800  azithromycin (ZITHROMAX) 500 mg in sodium chloride 0.9 % 250 mL IVPB     500 mg 250 mL/hr over 60 Minutes Intravenous Every 24 hours 04/11/18 1706 04/18/18 1759   04/11/18 1130  vancomycin (VANCOCIN) IVPB 1000 mg/200 mL premix     1,000 mg 200 mL/hr over 60 Minutes Intravenous  Once 04/11/18 0948 04/11/18 1242   04/11/18 1000  vancomycin (VANCOCIN) IVPB 1000 mg/200 mL premix     1,000 mg 200 mL/hr over 60 Minutes Intravenous  Once 04/11/18 0948 04/11/18 1131   04/11/18 0945  piperacillin-tazobactam (ZOSYN) IVPB 3.375 g     3.375 g 100 mL/hr over 30 Minutes Intravenous  Once 04/11/18 0937 04/11/18 1007   04/11/18 0945  vancomycin (VANCOCIN) 2,000 mg in sodium chloride 0.9 % 500 mL IVPB  Status:  Discontinued     2,000 mg 250 mL/hr over 120 Minutes Intravenous  Once 04/11/18 0941 04/11/18 0947  Subjective: Seen and examined at bedside and that he did not feel good and his shortness of breath was worse.  No chest pain, lightheadedness or dizziness but states that he was having a difficult time breathing even though his saturations were 99%.  No other complaints or concerns at this time.  Objective: Vitals:   04/12/18 2039 04/13/18 0345 04/13/18 0544 04/13/18 0748  BP: (!) 113/56 108/72 127/64   Pulse: 89 80 88   Resp: (!) 24 (!) 21 20   Temp: 99.5 F (37.5 C) 98.4 F (36.9 C) (!) 102.1 F (38.9 C) 99.4 F (37.4 C)  TempSrc: Oral Oral Oral Oral  SpO2: 97% 99%    Weight:      Height:        Intake/Output Summary (Last 24 hours) at 04/13/2018 1334 Last data filed at 04/13/2018 1000 Gross per 24 hour  Intake 3388.75 ml  Output 2400 ml  Net 988.75 ml   Filed Weights   04/11/18 0918  Weight: (!) 145.2 kg (320 lb)   Examination: Physical Exam:  Constitutional:  Well-nourished, well-developed obese African-American male whose complaint dyspnea and appears uncomfortable but is not currently no acute distress Eyes: Sclera are anicteric.  Lids and conjunctive are normal. ENMT: External ears and nose appear normal.  Grossly normal hearing Neck: Supple with no JVD Respiratory: Diminished to auscultation bilaterally with left being worse than right.  Had some mild rhonchi.  Has unlabored breathing but is feeling dyspneic.  No accessory muscle usage and no appreciable wheezing or crackles. Cardiovascular: Regular rate and rhythm.  No appreciable murmurs, rubs, gallops. Abdomen: Soft, nontender, distended secondary body habitus.  Bowel sounds present all 4 quadrants GU: Deferred Musculoskeletal: No contractures or cyanosis.  No joint deformities noted Skin: Skin is warm and dry with no appreciable rashes or lesions on limited skin evaluation Neurologic: Cranial nerves II through XII grossly intact with no appreciable focal deficits.  Psychiatric: Normal judgment and insight.  Patient awake and alert and oriented x3 calm.  Depressed appearing mood slightly flat affect  Data Reviewed: I have personally reviewed following labs and imaging studies  CBC: Recent Labs  Lab 04/11/18 0947 04/11/18 1717 04/12/18 0541 04/13/18 0540  WBC 10.2 8.7 7.3 4.2  NEUTROABS 7.5  --  5.3 2.7  HGB 13.3 12.0* 11.4* 11.1*  HCT 38.0* 34.9* 33.4* 33.0*  MCV 86.4 87.0 88.6 88.9  PLT 224 229 207 366   Basic Metabolic Panel: Recent Labs  Lab 04/11/18 1025 04/11/18 1717 04/12/18 0541 04/13/18 0540  NA 124*  --  132* 134*  K 3.6  --  4.1 3.8  CL 92*  --  98* 99*  CO2 20*  --  26 26  GLUCOSE 426*  --  309* 267*  BUN 13  --  15 15  CREATININE 1.27* 1.29* 1.11 1.00  CALCIUM 8.2*  --  8.3* 8.3*  MG  --   --  1.7 1.8  PHOS  --   --  1.7* 2.6   GFR: Estimated Creatinine Clearance: 136.2 mL/min (by C-G formula based on SCr of 1 mg/dL). Liver Function Tests: Recent Labs    Lab 04/11/18 1025 04/12/18 0541 04/13/18 0540  AST 37 52* 96*  ALT 34 45 82*  ALKPHOS 57 46 51  BILITOT 2.2* 1.7* 0.9  PROT 7.6 6.9 6.6  ALBUMIN 3.6 3.1* 2.9*   No results for input(s): LIPASE, AMYLASE in the last 168 hours. No results for input(s): AMMONIA in the last 168 hours.  Coagulation Profile: No results for input(s): INR, PROTIME in the last 168 hours. Cardiac Enzymes: Recent Labs  Lab 04/11/18 1717 04/11/18 2250 04/12/18 0541  TROPONINI <0.03 <0.03 <0.03   BNP (last 3 results) No results for input(s): PROBNP in the last 8760 hours. HbA1C: Recent Labs    04/12/18 0541  HGBA1C 9.9*   CBG: Recent Labs  Lab 04/12/18 1143 04/12/18 1639 04/12/18 2225 04/13/18 0748 04/13/18 1216  GLUCAP 225* 316* 259* 258* 272*   Lipid Profile: No results for input(s): CHOL, HDL, LDLCALC, TRIG, CHOLHDL, LDLDIRECT in the last 72 hours. Thyroid Function Tests: Recent Labs    04/11/18 1717  TSH 1.612   Anemia Panel: No results for input(s): VITAMINB12, FOLATE, FERRITIN, TIBC, IRON, RETICCTPCT in the last 72 hours. Sepsis Labs: Recent Labs  Lab 04/11/18 1610 04/11/18 1151 04/11/18 1717 04/11/18 1946  PROCALCITON  --   --  0.87  --   LATICACIDVEN 2.16* 1.53 1.4 1.7    Recent Results (from the past 240 hour(s))  Blood Culture (routine x 2)     Status: None (Preliminary result)   Collection Time: 04/11/18  9:47 AM  Result Value Ref Range Status   Specimen Description   Final    BLOOD LEFT FOREARM Performed at [facility], [address] [place], [place] [address]    Special Requests   Final    BOTTLES DRAWN AEROBIC AND ANAEROBIC Blood Culture adequate volume   Culture   Final    NO GROWTH < 24 HOURS Performed at [facility], [place] [address], [place], [place] [address]    Report Status PENDING  Incomplete  Rapid Strep Screen ([facility] ONLY)     Status: None   Collection Time: 04/11/18  9:56 AM  Result Value Ref Range Status    Streptococcus, Group A Screen (Direct) NEGATIVE NEGATIVE Final    Comment: (NOTE) A Rapid Antigen test may result negative if the antigen level in the sample is below the detection level of this test. The FDA has not cleared this test as a stand-alone test therefore the rapid antigen negative result has reflexed to a Group A Strep culture. Performed at [facility], [address] [place], [place] [address]   Culture, group A strep     Status: None (Preliminary result)   Collection Time: 04/11/18  9:56 AM  Result Value Ref Range Status   Specimen Description   Final    THROAT Performed at [facility], [address] [place], [place] [address]    Special Requests   Final    NONE Reflexed from [id number] Performed at [facility], [address] [place], [place] [address]    Culture   Final    CULTURE REINCUBATED FOR BETTER GROWTH Performed at [facility], [place] [address], [place], [place] [address]    Report Status PENDING  Incomplete  Blood Culture (routine x 2)     Status: None (Preliminary result)   Collection Time: 04/11/18 10:00 AM  Result Value Ref Range Status   Specimen Description   Final    BLOOD RIGHT ARM Performed at [facility], [address] [place], [place] [address]    Special Requests   Final    BOTTLES DRAWN AEROBIC AND ANAEROBIC Blood Culture adequate volume Performed at [facility], [address] [place], [place] [address]    Culture  Final    NO GROWTH < 24 HOURS Performed at [facility], [place] [address] [place], [place] [address]    Report Status PENDING  Incomplete  Respiratory Panel by PCR     Status: None   Collection Time: 04/11/18  5:29 PM  Result Value Ref Range Status   Adenovirus NOT DETECTED NOT DETECTED Final   Coronavirus 229E NOT DETECTED NOT DETECTED Final   Coronavirus HKU1 NOT DETECTED NOT DETECTED Final   Coronavirus NL63 NOT DETECTED NOT DETECTED Final    Coronavirus OC43 NOT DETECTED NOT DETECTED Final   Metapneumovirus NOT DETECTED NOT DETECTED Final   Rhinovirus / Enterovirus NOT DETECTED NOT DETECTED Final   Influenza A NOT DETECTED NOT DETECTED Final   Influenza B NOT DETECTED NOT DETECTED Final   Parainfluenza Virus 1 NOT DETECTED NOT DETECTED Final   Parainfluenza Virus 2 NOT DETECTED NOT DETECTED Final   Parainfluenza Virus 3 NOT DETECTED NOT DETECTED Final   Parainfluenza Virus 4 NOT DETECTED NOT DETECTED Final   Respiratory Syncytial Virus NOT DETECTED NOT DETECTED Final   Bordetella pertussis NOT DETECTED NOT DETECTED Final   Chlamydophila pneumoniae NOT DETECTED NOT DETECTED Final   Mycoplasma pneumoniae NOT DETECTED NOT DETECTED Final    Comment: Performed at [facility], [place] [address] [place], [place] [address]  Culture, sputum-assessment     Status: None   Collection Time: 04/11/18  7:54 PM  Result Value Ref Range Status   Specimen Description EXPECTORATED SPUTUM  Final   Special Requests NONE  Final   Sputum evaluation   Final    THIS SPECIMEN IS ACCEPTABLE FOR SPUTUM CULTURE Performed at [facility], [place] [address] [place], [place] [address]    Report Status 04/11/2018 FINAL  Final  Culture, respiratory (NON-Expectorated)     Status: None (Preliminary result)   Collection Time: 04/11/18  7:54 PM  Result Value Ref Range Status   Specimen Description   Final    EXPECTORATED SPUTUM Performed at [place] [address] [place], [place] [address]    Special Requests   Final    NONE Reflexed from [id number] Performed at [facility], [place] [address] [place], [place] [address]    Gram Stain   Final    RARE WBC PRESENT, PREDOMINANTLY PMN RARE GRAM POSITIVE COCCI RARE GRAM POSITIVE RODS    Culture   Final    CULTURE REINCUBATED FOR BETTER GROWTH Performed at [facility], [place] [address], [place], [place] [address]    Report Status PENDING   Incomplete    Radiology Studies: Dg Chest 2 View  Result Date: 04/12/2018 CLINICAL DATA:  Worsening cough, sob, mid chest, mid abd pain this a.m, EXAM: CHEST - 2 VIEW COMPARISON:  Chest x-ray dated 04/11/2018. FINDINGS: Increased size of the LEFT lower lung pneumonia. RIGHT lung remains clear. Heart size and mediastinal contours are stable. IMPRESSION: Increased size of the LEFT lower lung pneumonia, now large. Electronically Signed   By: Franki Cabot M.D.   On: 04/12/2018 10:38   Dg Chest Port 1 View  Result Date: 04/13/2018 CLINICAL DATA:  Shortness of breath. EXAM: PORTABLE CHEST 1 VIEW COMPARISON:  04/12/2018 FINDINGS: The cardiomediastinal silhouette is unchanged. Extensive airspace opacity throughout the left mid and lower lung has not significantly changed. The right lung remains clear. No large pleural effusion or pneumothorax is identified. IMPRESSION: Unchanged left lung pneumonia. Electronically Signed   By: Logan Bores  M.D.   On: 04/13/2018 07:19   US Abdomen Limited Ruq  Result Date: 04/12/2018 CLINICAL DATA:  Hyperbilirubinemia EXAM: ULTRASOUND ABDOMEN LIMITED RIGHT UPPER QUADRANT COMPARISON:  None. FINDINGS: Gallbladder: Contracted. No gallstones or wall thickening visualized. No sonographic Murphy sign noted by sonographer. Common bile duct: Diameter: 5 mm Liver: No focal lesion identified. Liver is diffusely echogenic indicating fatty infiltration. Portal vein is patent on color Doppler imaging with normal direction of blood flow towards the liver. IMPRESSION: 1. No acute findings. No bile duct dilatation. No evidence of cholecystitis. 2. Fatty infiltration of the liver. Electronically Signed   By: Franki Cabot M.D.   On: 04/12/2018 11:13   Scheduled Meds: . chlorhexidine  15 mL Mouth Rinse BID  . enoxaparin (LOVENOX) injection  40 mg Subcutaneous Q24H  . guaiFENesin  1,200 mg Oral BID  . insulin aspart  0-15 Units Subcutaneous TID WC  . insulin aspart  0-5 Units Subcutaneous  QHS  . insulin aspart  4 Units Subcutaneous TID WC  . insulin glargine  25 Units Subcutaneous Daily  . mouth rinse  15 mL Mouth Rinse q12n4p  . methylPREDNISolone (SOLU-MEDROL) injection  60 mg Intravenous Q12H  . sodium chloride HYPERTONIC  4 mL Nebulization Daily   Continuous Infusions: . sodium chloride 100 mL/hr at 04/13/18 0746  . azithromycin Stopped (04/12/18 1835)  . cefTRIAXone (ROCEPHIN)  IV Stopped (04/12/18 2014)    LOS: 2 days   Kerney Elbe, DO [facility] Pager [phone]  If 7PM-7AM, please contact night-coverage [email or web address] Password [facility] 04/13/2018, 1:34 PM

## 2018-04-14 ENCOUNTER — Inpatient Hospital Stay (HOSPITAL_COMMUNITY): Payer: Self-pay

## 2018-04-14 DIAGNOSIS — F99 Mental disorder, not otherwise specified: Secondary | ICD-10-CM

## 2018-04-14 DIAGNOSIS — F4320 Adjustment disorder, unspecified: Secondary | ICD-10-CM

## 2018-04-14 LAB — PHOSPHORUS: Phosphorus: 3.9 mg/dL (ref 2.5–4.6)

## 2018-04-14 LAB — CBC WITH DIFFERENTIAL/PLATELET
Basophils Absolute: 0 10*3/uL (ref 0.0–0.1)
Basophils Relative: 0 %
EOS PCT: 0 %
Eosinophils Absolute: 0 10*3/uL (ref 0.0–0.7)
HCT: 31.6 % — ABNORMAL LOW (ref 39.0–52.0)
Hemoglobin: 10.7 g/dL — ABNORMAL LOW (ref 13.0–17.0)
LYMPHS PCT: 17 %
Lymphs Abs: 0.7 10*3/uL (ref 0.7–4.0)
MCH: 29.6 pg (ref 26.0–34.0)
MCHC: 33.9 g/dL (ref 30.0–36.0)
MCV: 87.3 fL (ref 78.0–100.0)
MONO ABS: 0.1 10*3/uL (ref 0.1–1.0)
MONOS PCT: 2 %
Neutro Abs: 3.5 10*3/uL (ref 1.7–7.7)
Neutrophils Relative %: 81 %
Platelets: 255 10*3/uL (ref 150–400)
RBC: 3.62 MIL/uL — ABNORMAL LOW (ref 4.22–5.81)
RDW: 12.3 % (ref 11.5–15.5)
WBC: 4.3 10*3/uL (ref 4.0–10.5)

## 2018-04-14 LAB — COMPREHENSIVE METABOLIC PANEL
ALT: 96 U/L — ABNORMAL HIGH (ref 17–63)
AST: 67 U/L — AB (ref 15–41)
Albumin: 2.7 g/dL — ABNORMAL LOW (ref 3.5–5.0)
Alkaline Phosphatase: 65 U/L (ref 38–126)
Anion gap: 10 (ref 5–15)
BUN: 22 mg/dL — AB (ref 6–20)
CHLORIDE: 99 mmol/L — AB (ref 101–111)
CO2: 21 mmol/L — ABNORMAL LOW (ref 22–32)
Calcium: 8.5 mg/dL — ABNORMAL LOW (ref 8.9–10.3)
Creatinine, Ser: 0.99 mg/dL (ref 0.61–1.24)
GFR calc Af Amer: >60 mL/min (ref >60)
GFR calc non Af Amer: >60 mL/min (ref >60)
GLUCOSE: 390 mg/dL — AB (ref 65–99)
POTASSIUM: 4.5 mmol/L (ref 3.5–5.1)
Sodium: 130 mmol/L — ABNORMAL LOW (ref 135–145)
Total Bilirubin: 0.9 mg/dL (ref 0.3–1.2)
Total Protein: 6.6 g/dL (ref 6.5–8.1)

## 2018-04-14 LAB — GLUCOSE, CAPILLARY
GLUCOSE-CAPILLARY: 385 mg/dL — AB (ref 65–99)
GLUCOSE-CAPILLARY: 466 mg/dL — AB (ref 65–99)
Glucose-Capillary: 365 mg/dL — ABNORMAL HIGH (ref 65–99)
Glucose-Capillary: 359 mg/dL — ABNORMAL HIGH (ref 65–99)

## 2018-04-14 LAB — CULTURE, RESPIRATORY W GRAM STAIN: Culture: NORMAL

## 2018-04-14 LAB — CULTURE, RESPIRATORY

## 2018-04-14 LAB — MAGNESIUM: Magnesium: 1.9 mg/dL (ref 1.7–2.4)

## 2018-04-14 MED ORDER — INSULIN ASPART 100 UNIT/ML ~~LOC~~ SOLN
7.0000 [IU] | Freq: Once | SUBCUTANEOUS | Status: AC
  Administered 2018-04-14: 7 [IU] via SUBCUTANEOUS

## 2018-04-14 MED ORDER — ZOLPIDEM TARTRATE 5 MG PO TABS
5.0000 mg | ORAL_TABLET | Freq: Every evening | ORAL | Status: DC | PRN
  Administered 2018-04-14 – 2018-04-15 (×2): 5 mg via ORAL
  Filled 2018-04-14 (×2): qty 1

## 2018-04-14 MED ORDER — METHYLPREDNISOLONE SODIUM SUCC 40 MG IJ SOLR
40.0000 mg | Freq: Two times a day (BID) | INTRAMUSCULAR | Status: DC
  Administered 2018-04-14 – 2018-04-15 (×2): 40 mg via INTRAVENOUS
  Filled 2018-04-14 (×2): qty 1

## 2018-04-14 NOTE — Consult Note (Signed)
[facility] Face-to-Face Psychiatry Consult   Reason for Consult: ''?Bipolar, has inappropriate behavior.'' Referring Physician:  Dr. Alfredia Ferguson Patient Identification: William Marks MRN:  [id number] Principal Diagnosis: Adjustment disorder, unspecified Diagnosis:   Patient Active Problem List   Diagnosis Date Noted  . Adjustment disorder, unspecified [F43.20] 04/14/2018  . Sepsis due to pneumonia ([place]) [J18.9, A41.9] 04/11/2018  . Lingular pneumonia [J18.9] 04/11/2018  . Dyspnea [R06.00] 04/11/2018  . Essential hypertension [I10] 04/11/2018  . Diabetes mellitus type 2 in obese ([place]) [E11.69, E66.9] 04/11/2018  . AKI (acute kidney injury) ([place]) [N17.9] 04/11/2018  . Hyponatremia [E87.1] 04/11/2018  . Hepatic steatosis [K76.0] 04/11/2018  . Hyperbilirubinemia [E80.6] 04/11/2018  . Lactic acidosis [E87.2] 04/11/2018  . Microscopic hematuria [R31.29] 04/11/2018  . Tobacco abuse [Z72.0] 04/11/2018    Total Time spent with patient: 45 minutes  Subjective:   William Marks is a 52 y.o. male patient admitted with fever.  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 He denies history of mental illness and states that ''I have never seen a psychiatrist in my life.'' He is calm, cooperative, alert, oriented x4 and denies depression, mood swings, psychosis or delusional thinking. Patient also denies alcohol or drug abuse and vehemently denies being ''crazy''.  Labs review revealed low sodium level -130 and symptoms of Hyponatremia include, altered personality, confusion and lethargy.   Past Psychiatric History: none reported by patient  Risk to Self:   Risk to Others:   Prior Inpatient Therapy:   Prior Outpatient Therapy:    Past Medical History:  Past Medical History:  Diagnosis Date  . Diabetes mellitus without complication ([place])   . Hypertension    History reviewed. No pertinent surgical  history. Family History: History reviewed. No pertinent family history. Family Psychiatric  History:  Social History:  Social History   Substance and Sexual Activity  Alcohol Use Not Currently     Social History   Substance and Sexual Activity  Drug Use Not on file    Social History   Socioeconomic History  . Marital status: Single    Spouse name: Not on file  . Number of children: Not on file  . Years of education: Not on file  . Highest education level: Not on file  Occupational History  . Not on file  Social Needs  . Financial resource strain: Not on file  . Food insecurity:    Worry: Not on file    Inability: Not on file  . Transportation needs:    Medical: Not on file    Non-medical: Not on file  Tobacco Use  . Smoking status: Current Every Day Smoker  . Smokeless tobacco: Never Used  Substance and Sexual Activity  . Alcohol use: Not Currently  . Drug use: Not on file  . Sexual activity: Not on file  Lifestyle  . Physical activity:    Days per week: Not on file    Minutes per session: Not on file  . Stress: Not on file  Relationships  . Social connections:    Talks on phone: Not on file    Gets together: Not on file    Attends religious service: Not on file    Active member of club or organization: Not on file    Attends meetings of clubs or organizations: Not on file    Relationship status: Not on file  Other Topics Concern  . Not on  file  Social History Narrative  . Not on file   Additional Social History:    Allergies:  No Known Allergies  Labs:  Results for orders placed or performed during the hospital encounter of 04/11/18 (from the past 48 hour(s))  Glucose, capillary     Status: Abnormal   Collection Time: 04/12/18  4:39 PM  Result Value Ref Range   Glucose-Capillary 316 (H) 65 - 99 mg/dL  Glucose, capillary     Status: Abnormal   Collection Time: 04/12/18 10:25 PM  Result Value Ref Range   Glucose-Capillary 259 (H) 65 - 99 mg/dL   CBC with Differential/Platelet     Status: Abnormal   Collection Time: 04/13/18  5:40 AM  Result Value Ref Range   WBC 4.2 4.0 - 10.5 K/uL   RBC 3.71 (L) 4.22 - 5.81 MIL/uL   Hemoglobin 11.1 (L) 13.0 - 17.0 g/dL   HCT 33.0 (L) 39.0 - 52.0 %   MCV 88.9 78.0 - 100.0 fL   MCH 29.9 26.0 - 34.0 pg   MCHC 33.6 30.0 - 36.0 g/dL   RDW 12.5 11.5 - 15.5 %   Platelets 210 150 - 400 K/uL   Neutrophils Relative % 65 %   Neutro Abs 2.7 1.7 - 7.7 K/uL   Lymphocytes Relative 23 %   Lymphs Abs 1.0 0.7 - 4.0 K/uL   Monocytes Relative 11 %   Monocytes Absolute 0.4 0.1 - 1.0 K/uL   Eosinophils Relative 1 %   Eosinophils Absolute 0.1 0.0 - 0.7 K/uL   Basophils Relative 0 %   Basophils Absolute 0.0 0.0 - 0.1 K/uL    Comment: Performed at [facility], [place] [address] [place], [place] [address]  Comprehensive metabolic panel     Status: Abnormal   Collection Time: 04/13/18  5:40 AM  Result Value Ref Range   Sodium 134 (L) 135 - 145 mmol/L   Potassium 3.8 3.5 - 5.1 mmol/L   Chloride 99 (L) 101 - 111 mmol/L   CO2 26 22 - 32 mmol/L   Glucose, Bld 267 (H) 65 - 99 mg/dL   BUN 15 6 - 20 mg/dL   Creatinine, Ser 1.00 0.61 - 1.24 mg/dL   Calcium 8.3 (L) 8.9 - 10.3 mg/dL   Total Protein 6.6 6.5 - 8.1 g/dL   Albumin 2.9 (L) 3.5 - 5.0 g/dL   AST 96 (H) 15 - 41 U/L   ALT 82 (H) 17 - 63 U/L   Alkaline Phosphatase 51 38 - 126 U/L   Total Bilirubin 0.9 0.3 - 1.2 mg/dL   GFR calc non Af Amer >60 >60 mL/min   GFR calc Af Amer >60 >60 mL/min    Comment: (NOTE) The eGFR has been calculated using the CKD EPI equation. This calculation has not been validated in all clinical situations. eGFR's persistently <60 mL/min signify possible Chronic Kidney Disease.    Anion gap 9 5 - 15    Comment: Performed at [facility], [place] [address], [place], [place] [address]  Magnesium     Status: None   Collection Time: 04/13/18  5:40 AM  Result Value Ref Range   Magnesium 1.8 1.7 -  2.4 mg/dL    Comment: Performed at [facility], [place] [address], [place], [place] [address]  Phosphorus     Status: None   Collection Time: 04/13/18  5:40 AM  Result Value Ref Range   Phosphorus 2.6 2.5 - 4.6 mg/dL  Comment: Performed at [facility], [place] [address] [place], [place] [address]  Glucose, capillary     Status: Abnormal   Collection Time: 04/13/18  7:48 AM  Result Value Ref Range   Glucose-Capillary 258 (H) 65 - 99 mg/dL   Comment 1 Notify RN   Glucose, capillary     Status: Abnormal   Collection Time: 04/13/18 12:16 PM  Result Value Ref Range   Glucose-Capillary 272 (H) 65 - 99 mg/dL   Comment 1 Notify RN   Comprehensive metabolic panel     Status: Abnormal   Collection Time: 04/13/18  1:54 PM  Result Value Ref Range   Sodium 132 (L) 135 - 145 mmol/L   Potassium 4.1 3.5 - 5.1 mmol/L   Chloride 100 (L) 101 - 111 mmol/L   CO2 24 22 - 32 mmol/L   Glucose, Bld 351 (H) 65 - 99 mg/dL   BUN 17 6 - 20 mg/dL   Creatinine, Ser 0.99 0.61 - 1.24 mg/dL   Calcium 8.5 (L) 8.9 - 10.3 mg/dL   Total Protein 6.7 6.5 - 8.1 g/dL   Albumin 2.8 (L) 3.5 - 5.0 g/dL   AST 114 (H) 15 - 41 U/L   ALT 103 (H) 17 - 63 U/L   Alkaline Phosphatase 62 38 - 126 U/L   Total Bilirubin 1.4 (H) 0.3 - 1.2 mg/dL   GFR calc non Af Amer >60 >60 mL/min   GFR calc Af Amer >60 >60 mL/min    Comment: (NOTE) The eGFR has been calculated using the CKD EPI equation. This calculation has not been validated in all clinical situations. eGFR's persistently <60 mL/min signify possible Chronic Kidney Disease.    Anion gap 8 5 - 15    Comment: Performed at [facility], [place] [address] [place], [place] [address]  Urinalysis, Routine w reflex microscopic     Status: Abnormal   Collection Time: 04/13/18  2:15 PM  Result Value Ref Range   Color, Urine YELLOW YELLOW   APPearance CLEAR CLEAR   Specific Gravity, Urine 1.025 1.005 - 1.030   pH 6.0 5.0 - 8.0    Glucose, UA >=500 (A) NEGATIVE mg/dL   Hgb urine dipstick MODERATE (A) NEGATIVE   Bilirubin Urine NEGATIVE NEGATIVE   Ketones, ur 5 (A) NEGATIVE mg/dL   Protein, ur NEGATIVE NEGATIVE mg/dL   Nitrite NEGATIVE NEGATIVE   Leukocytes, UA NEGATIVE NEGATIVE   RBC / HPF 11-20 0 - 5 RBC/hpf   WBC, UA 0-5 0 - 5 WBC/hpf   Bacteria, UA NONE SEEN NONE SEEN    Comment: Performed at [facility], [place] [address], [place], [place] [address]  Glucose, capillary     Status: Abnormal   Collection Time: 04/13/18  4:56 PM  Result Value Ref Range   Glucose-Capillary 374 (H) 65 - 99 mg/dL   Comment 1 Notify RN   Glucose, capillary     Status: Abnormal   Collection Time: 04/13/18  8:10 PM  Result Value Ref Range   Glucose-Capillary 435 (H) 65 - 99 mg/dL  CBC with Differential/Platelet     Status: Abnormal   Collection Time: 04/14/18  5:11 AM  Result Value Ref Range   WBC 4.3 4.0 - 10.5 K/uL   RBC 3.62 (L) 4.22 - 5.81 MIL/uL   Hemoglobin 10.7 (L) 13.0 - 17.0 g/dL   HCT 31.6 (L) 39.0 - 52.0 %   MCV 87.3 78.0 - 100.0 fL   MCH 29.6  26.0 - 34.0 pg   MCHC 33.9 30.0 - 36.0 g/dL   RDW 12.3 11.5 - 15.5 %   Platelets 255 150 - 400 K/uL   Neutrophils Relative % 81 %   Neutro Abs 3.5 1.7 - 7.7 K/uL   Lymphocytes Relative 17 %   Lymphs Abs 0.7 0.7 - 4.0 K/uL   Monocytes Relative 2 %   Monocytes Absolute 0.1 0.1 - 1.0 K/uL   Eosinophils Relative 0 %   Eosinophils Absolute 0.0 0.0 - 0.7 K/uL   Basophils Relative 0 %   Basophils Absolute 0.0 0.0 - 0.1 K/uL    Comment: Performed at [facility], [place] [address] [place], [place] [address]  Magnesium     Status: None   Collection Time: 04/14/18  5:11 AM  Result Value Ref Range   Magnesium 1.9 1.7 - 2.4 mg/dL    Comment: Performed at [facility], [place] [address] [place], [place] [address]  Phosphorus     Status: None   Collection Time: 04/14/18  5:11 AM  Result Value Ref Range   Phosphorus 3.9 2.5 -  4.6 mg/dL    Comment: Performed at [facility], [place] [address], [place], [place] [address]  Comprehensive metabolic panel     Status: Abnormal   Collection Time: 04/14/18  5:40 AM  Result Value Ref Range   Sodium 130 (L) 135 - 145 mmol/L   Potassium 4.5 3.5 - 5.1 mmol/L   Chloride 99 (L) 101 - 111 mmol/L   CO2 21 (L) 22 - 32 mmol/L   Glucose, Bld 390 (H) 65 - 99 mg/dL   BUN 22 (H) 6 - 20 mg/dL   Creatinine, Ser 0.99 0.61 - 1.24 mg/dL   Calcium 8.5 (L) 8.9 - 10.3 mg/dL   Total Protein 6.6 6.5 - 8.1 g/dL   Albumin 2.7 (L) 3.5 - 5.0 g/dL   AST 67 (H) 15 - 41 U/L   ALT 96 (H) 17 - 63 U/L   Alkaline Phosphatase 65 38 - 126 U/L   Total Bilirubin 0.9 0.3 - 1.2 mg/dL   GFR calc non Af Amer >60 >60 mL/min   GFR calc Af Amer >60 >60 mL/min    Comment: (NOTE) The eGFR has been calculated using the CKD EPI equation. This calculation has not been validated in all clinical situations. eGFR's persistently <60 mL/min signify possible Chronic Kidney Disease.    Anion gap 10 5 - 15    Comment: Performed at [facility], [place] [address], [place], [place] [address]  Glucose, capillary     Status: Abnormal   Collection Time: 04/14/18  7:42 AM  Result Value Ref Range   Glucose-Capillary 385 (H) 65 - 99 mg/dL   Comment 1 Notify RN   Glucose, capillary     Status: Abnormal   Collection Time: 04/14/18 11:52 AM  Result Value Ref Range   Glucose-Capillary 365 (H) 65 - 99 mg/dL   Comment 1 Notify RN     Current Facility-Administered Medications  Medication Dose Route Frequency Provider Last Rate Last Dose  . acetaminophen (TYLENOL) suppository 650 mg  650 mg Rectal Q4H PRN Raiford Noble Latif, DO      . acetaminophen (TYLENOL) tablet 650 mg  650 mg Oral Q6H PRN Raiford Noble Latif, DO   650 mg at 04/12/18 1057  . azithromycin (ZITHROMAX) 500 mg in sodium chloride 0.9 % 250 mL IVPB  500 mg Intravenous Q24H Sheikh, Omair [place],  DO   Stopped at 04/13/18 1937  .  cefTRIAXone (ROCEPHIN) 1 g in sodium chloride 0.9 % 100 mL IVPB  1 g Intravenous Q24H Raiford Noble [place], DO   Stopped at 04/13/18 2145  . chlorhexidine (PERIDEX) 0.12 % solution 15 mL  15 mL Mouth Rinse BID Raiford Noble [place], DO   15 mL at 04/14/18 1111  . enoxaparin (LOVENOX) injection 40 mg  40 mg Subcutaneous Q24H Raiford Noble [place], DO   40 mg at 04/13/18 2102  . guaiFENesin (MUCINEX) 12 hr tablet 1,200 mg  1,200 mg Oral BID Raiford Noble [place], DO   1,200 mg at 04/14/18 1114  . ibuprofen (ADVIL,MOTRIN) tablet 400 mg  400 mg Oral Q6H PRN Raiford Noble [place], DO   400 mg at 04/13/18 0546  . insulin aspart (novoLOG) injection 0-15 Units  0-15 Units Subcutaneous TID WC Raiford Noble [place], DO   15 Units at 04/14/18 1237  . insulin aspart (novoLOG) injection 0-5 Units  0-5 Units Subcutaneous QHS Raiford Noble [place], [place]   3 Units at 04/12/18 2251  . insulin aspart (novoLOG) injection 4 Units  4 Units Subcutaneous TID WC Raiford Noble [place], DO   4 Units at 04/14/18 1238  . insulin glargine (LANTUS) injection 25 Units  25 Units Subcutaneous Daily Raiford Noble [place], [place]   25 Units at 04/14/18 1113  . ipratropium-albuterol (DUONEB) 0.5-2.5 (3) MG/3ML nebulizer solution 3 mL  3 mL Nebulization Q6H PRN Raiford Noble Latif, DO      . MEDLINE mouth rinse  15 mL Mouth Rinse q12n4p Sheikh, Omair Latif, DO   15 mL at 04/13/18 1101  . methylPREDNISolone sodium succinate (SOLU-MEDROL) 125 mg/2 mL injection 60 mg  60 mg Intravenous Q12H Sheikh, Georgina Quint [place], DO   60 mg at 04/14/18 1111  . sodium chloride HYPERTONIC 3 % nebulizer solution 4 mL  4 mL Nebulization Daily Raiford Noble Latif, DO   4 mL at 04/13/18 1137    Musculoskeletal: Strength & Muscle Tone: within normal limits Gait & Station: normal Patient leans: N/A  Psychiatric Specialty Exam: Physical Exam  ROS  Blood pressure (!) 164/90, pulse 81, temperature 97.7 F (36.5 C), temperature source Oral, resp. rate 18, height 6' 4"  (1.93 m), weight  (!) 145.2 kg (320 lb), SpO2 97 %.Body mass index is 38.95 kg/m.  General Appearance: Casual  Eye Contact:  Good  Speech:  Clear and Coherent  Volume:  Normal  Mood:  Euthymic  Affect:  Appropriate  Thought Process:  Coherent and Linear  Orientation:  Full (Time, Place, and Person)  Thought Content:  Logical  Suicidal Thoughts:  No  Homicidal Thoughts:  No  Memory:  Immediate;   Good Recent;   Good Remote;   Good  Judgement:  Intact  Insight:  Fair  Psychomotor Activity:  Normal  Concentration:  Concentration: Fair and Attention Span: Fair  Recall:  Good  Fund of Knowledge:  Good  Language:  Good  Akathisia:  No  Handed:  Right  AIMS (if indicated):     Assets:  Communication Skills  ADL's:  Intact  Cognition:  WNL  Sleep:   good     Treatment Plan Summary: Patient who was admitted due to fever and shortness of breath. He denies prior history of mental illness. But lab review indicate low sodium level which may be responsible for personality changes observed yesterday.  Plan/Recommendations. -Consider aggressive management of Hyponatremia if applicable -No acute psychiatric intervention warranted until Hyponatremia is resolved -Psychiatric  service is signing off.  Disposition: No evidence of imminent risk to self or others at present.   Re-consult psych service as needed  Corena Pilgrim, MD 04/14/2018 12:40 PM

## 2018-04-14 NOTE — Progress Notes (Signed)
PROGRESS NOTE    William Marks  WUJ:[id number] DOB: [birth year] DOA: 04/11/2018 PCP: Patient, No Pcp Per   Brief Narrative:  William Marks is a 52 y.o. 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   Patient states symptoms started about 2 weeks ago and about 10 days ago he started having some nausea and vomiting daily and then developed some diarrhea on and off with 2-3 loose and sometimes watery movements a day.  Last bowel movement was day before admission and was nonbloody and emesis is also nonbloody.  Patient also admitted to having some dizziness and states that he developed progressively worsening shortness of breath and states that even when he lays flat  he is short of breath.  Denies any coughing up any sputum or any urinary complaints.  Denies any dysuria but did admit to having some abdominal cramps and pain on the left side of his abdomen.  Patient also complains of a on and off headache with dizziness but denies any chest pain, flank pain, blurred vision or double vision.  Patient states that for the last 4 days he has been trying to call over to come to the hospital but was unable to because of feeling sick and finally was able to and presented to the [facility] where he was transferred to [facility] long hospital for admission for sepsis secondary to community-acquired lingular pneumonia.  [facility] was called to admit this patient for the above complaints.  In the ED sepsis protocol was initiated and patient had a chest x-ray as well as a CT of the abdomen and pelvis which revealed patient having a lingular consolidation.  Patient was placed on IV antibiotics and given 2.8 L fluid resuscitation boluses and had basic blood work done.  He was transferred to [facility] for admission and is currently still being treated for his Left Sided Lingular  Consolidation. Patient was not improving significantly so IV Steroids were started along with Hypertonic Saline Nebs. Today has been the first day since he has been afebrile  Patient was exhibiting odd and inappropriate behavior today per nursing by sitting on the floor of his room, walking out in the hallway and stripping his clothes, along with other behaviors so Psychiatry was consulted for further evaluation and stated that no acute Psychiatric Intervention was warranted until hyponatremia is resolved.   Assessment & Plan:   Principal Problem:   Adjustment disorder, unspecified Active Problems:   Sepsis due to pneumonia ([place])   Lingular pneumonia   Dyspnea   Essential hypertension   Diabetes mellitus type 2 in obese (HCC)   AKI (acute kidney injury) ([place])   Hyponatremia   Hepatic steatosis   Hyperbilirubinemia   Lactic acidosis   Microscopic hematuria   Tobacco abuse  Sepsis 2/2 to Community Acquired Lingular Pneumonia -Admit to inpatient telemetry -On admission patient met sepsis criteria and was febrile with temperature 103.2, a pulse rate of 117, respiratory rate of 32, lactic acid level 2.16 -Sepsis protocol started in the ED and patient was given 2800 mL of Lactated Ringer boluses; Given 1 more Liter Bolus and started on IVF Maintenance that has now been discontinued.  -Lactic acid level was 2.16 on admission and then trended down to 1.53 -Checked Procalcitonin and was 0.97 -Sepsis Physiology improving however patient continued to spike temperatures; Today is the first day he has been  Afebrile  -Started empiric antibiotics and patient received IV vancomycin and IV Zosyn however will change to IV Ceftriaxone and IV Azithromycin given that this is a community-acquired pneumonia -Admission Chest x-ray showed prominent left midlung field infiltrate consistent with pneumonia and CT scan of the abdomen and pelvis showed a lingular consolidation -Repeat CXR this AM showed: No change  from the previous day's study and had persistent left mid to lower lung consolidation consistent with pneumonia -Continue with duo nebs 3 mL every 6 scheduled and albuterol every 2 as needed -Added Hypertonic Saline Nebs to regimen -Started Mucinex 1200 g p.o. twice daily, flutter valve, and incentive spirometer -Added IV Steroids with Solumedrol 60 mg q12 as patient was not significantly improving; Will now wean to 40 mg q12h -Blood cultures x2 obtained in the ED and will obtain a sputum culture -Blood Cx x2 showed No Growth at 3 Days  -Sputum Gram Stain showed Rare WBC, Predominantly PMN; Rare Gram Positive Cocci, Rare Gram Positive Rods with Cx Pending -Strep Pneumo Urinary Ag Negative and Legionella Urine Ag pending  -Urinalysis was not really consistent with a urinary tract infection but will still send urinary culture -Follow-up culture results and repeat CBC in the a.m. -Influenza A/B and Respiratory Virus Panel Negative so D/C'd Droplet Precautions -Antipyretics with Acetaminophen every 6 as needed and then added po Motrin 400 mg q6hprn for Fever as well  -Discontinued Telemetry as patient was not able to keep it on -Patient felt dyspneic so was started on Supplemental O2 via [place] for comfort even though he was saturating 99% on Room Air   -If not improving or worsening will Discuss Case with Pulmonology for the need for a formal consultation  -Repeat CXR in AM and will obtain CT Chest w/o Contrast to evaluate PNA further    Dyspnea -Likely from above however needed to rule out a cardiac etiology  -We will cycle patient's cardiac troponins and check an Echocardiogram -Cardiac Troponin <0.03 x3; ECHOCardiogram showed EF of 60-65% with Grade 1 DD -Repeat CXR yesterday showed Worsening PNA and was unchanged today  -As above  Nausea/Vomiting/Diarrhea, improved  -? From Viral illness causing Pneumonia -Supportive Care and Antiemetics -Stopped IVF Hydration as above and placed on Heart  Healthy/Carb Modified Diet but changed it to just Carb Modified tody   Essential Hypertension but currently running softer BP's -Hold patient's home losartan-hydrochlorothiazide combination low blood pressures and AKI -Continued with IV fluid hydration as above -Continue to monitor vital signs per protocol  Diabetes Mellitus Type 2 -Hold patient's metformin at thousand milligrams p.o. twice daily along with glimepiride 4 mg p.o. Daily -Checked Hemoglobin A1c and was 9.9 -Placed on moderate NovoLog sliding scale AC and at bedtime and also started Lantus 25 units sq Daily at the recommendation of the Diabetes Education Coordinator along with 4 units of Novolog TIDwm -Continue to monitor CBGs; CBG's ranging from 272-435;  -Blood Sugars likely elevated in IV Steroid Use and will De-escalate IV Steroids  -Diabetes Education Coordinator Consulted for further evaluation and recommendations  -Will need Insulin at Discharge and Diabetes Education Coordinator recommending Novolin 70/30 15-20 units BID   Acute Kidney Injury but suspecting some form of CKD (Likely Stage 2) -Unknown baseline at this time -Likely prerenal in the setting of nausea, vomiting, diarrhea, as well as losartan hydrochlorothiazide combination -Avoid nephrotoxic medications and Hold Losartan-HCTZ -Continue with IV fluid hydration with normal saline at rate of 75 mL/per hour -BUN/Cr improved from 13/1.27 -> 15/1.11 -> 15/1.00 ->  22/0.99 -Continue to monitor and repeat CMP in a.m.  Hyponatremia/Pseudohyponatremia -In the Setting of hyperglycemia and dehydration from sepsis nausea and vomiting as well as diarrhea -Corrected sodium was 132 on admission -Now Sodium value went up from 124 -> 134 but was 130 today (Blood Sugar was 390) -Corrected Sodium was 137 according to [employer]  -Discontinued IV fluid hydration as above -Repeat CMP in a.m.  Diffuse Hepatic Steatosis/Abnormal LFTs -On CT of the abdomen and pelvis  with contrast -Checked CMP and LFTs showed AST of 37 and ALT of 34 on admission but AST is now 56 and LT is now 71 -Patient stated that he drinks alcohol occasionally and does not drink every day; Diet Education given  -Checked Right Upper quadrant ultrasound showed No acute findings. No bile duct dilatation. No evidence of cholecystitis. Fatty infiltration of the liver. -Check Acute Hepatitis Panel  -Repeat CMP in a.m.  Hyperbilirubinemia, improved -Patient's T bili was 2.2 on Admission now trended down to 0.9 -Right upper quadrant ultrasound as above -Continue to monitor and trend and repeat CMP in the a.m.  Lactic Acidosis, improved  -In the setting of sepsis and metformin -We will hold metformin and continue IV fluid hydration -Lactic acid level improved and went from 2.16 and is now 1.7  Microscopic Hematuria -History of nephrolithiasis per patient -On admission patient's urinalysis showed a clear appearance large hemoglobin on his urine dipstick and 6-10 red blood cells per high-powered field -Current urine culture is pending to be sent -Repeat urinalysis yesterday did again show Moderate Hb and >500 Glucose -Will need outpatient follow-up with Urology  Tobacco Abuse -Patient states he has not been smoking over a month however he has smoked since the age of 64 and trying to quit -Smoking cessation counseling provided   ? Right Renal Artery Aneurysm -Follow up with [facility] in 1-2 years as an outpatient   Hypophosphatemia -Patient's Phos Level was 1.7 and improved to 3.9 -Replete with IV potassium phosphate 20 mmol yesterday  -Continue to monitor and replete as necessary -Repeat phosphorus level in a.m.  ?Bipolar/Inappropirate Behavior -He was sitting on the floor of his room multiple times -Also walking out in the hallway and stripping his clothes, along with other behaviors  -? Steroid Induced Psychosis  -Psychiatry was consulted for further evaluation and stated that  no acute Psychiatric Intervention was warranted until hyponatremia is resolved however he has a normal corrected Sodium at 137 -Continue to Monitor these behaviors and if worsening will re-consult Psychiatry in AM   DVT prophylaxis: Enoxaparin 40 mg sq q24h Code Status: FULL CODE Family Communication: No family present at bedside  Disposition Plan: Remain Inpatient for continued Workup and Treatment for PNA  Consultants:   None   Procedures:  ECHOCARDIOGRAM ------------------------------------------------------------------- Study Conclusions  - Left ventricle: The cavity size was normal. Wall thickness was   increased in a pattern of mild LVH. Systolic function was normal.   The estimated ejection fraction was in the range of 60% to 65%.   Wall motion was normal; there were no regional wall motion   abnormalities. Doppler parameters are consistent with abnormal   left ventricular relaxation (grade 1 diastolic dysfunction). - Left atrium: The atrium was mildly dilated.  Antimicrobials:  Anti-infectives (From admission, onward)   Start     Dose/Rate Route Frequency Ordered Stop   04/11/18 2000  cefTRIAXone (ROCEPHIN) 1 g in sodium chloride 0.9 % 100 mL IVPB     1 g 200 mL/hr over 30  Minutes Intravenous Every 24 hours 04/11/18 1706 04/18/18 1959   04/11/18 1830  piperacillin-tazobactam (ZOSYN) IVPB 3.375 g  Status:  Discontinued     3.375 g 12.5 mL/hr over 240 Minutes Intravenous Every 8 hours 04/11/18 0950 04/11/18 1706   04/11/18 1800  azithromycin (ZITHROMAX) 500 mg in sodium chloride 0.9 % 250 mL IVPB     500 mg 250 mL/hr over 60 Minutes Intravenous Every 24 hours 04/11/18 1706 04/18/18 1759   04/11/18 1130  vancomycin (VANCOCIN) IVPB 1000 mg/200 mL premix     1,000 mg 200 mL/hr over 60 Minutes Intravenous  Once 04/11/18 0948 04/11/18 1242   04/11/18 1000  vancomycin (VANCOCIN) IVPB 1000 mg/200 mL premix     1,000 mg 200 mL/hr over 60 Minutes Intravenous  Once 04/11/18  0948 04/11/18 1131   04/11/18 0945  piperacillin-tazobactam (ZOSYN) IVPB 3.375 g     3.375 g 100 mL/hr over 30 Minutes Intravenous  Once 04/11/18 0937 04/11/18 1007   04/11/18 0945  vancomycin (VANCOCIN) 2,000 mg in sodium chloride 0.9 % 500 mL IVPB  Status:  Discontinued     2,000 mg 250 mL/hr over 120 Minutes Intravenous  Once 04/11/18 0941 04/11/18 0947     Subjective: Seen and examined and when I went to the room he was sitting on the floor next to his bed with his legs on a chair.  When asked about why he is doing this he stated "because I want to know if feels better."  Denies any chest pain, lightheadedness, dizziness.  States he had a bowel movement earlier today.  When getting up from the floor to the bed he states he felt a little short of breath and was very surprised by how big his pneumonia was.  No other concerns or complaints at this time.  Nursing reported patient having odd behaviors and walking out of the hallway stripping her clothes and saying that he was "protesting."  Objective: Vitals:   04/13/18 1438 04/13/18 2005 04/14/18 0015 04/14/18 0423  BP: (!) 134/92 139/90 133/72 (!) 164/90  Pulse: 91 89 (!) 59 81  Resp: 18 19 19 18   Temp: (!) 97.5 F (36.4 C) 98.9 F (37.2 C) 97.8 F (36.6 C) 97.7 F (36.5 C)  TempSrc: Oral Oral Oral Oral  SpO2: 98% 97% 98% 97%  Weight:      Height:        Intake/Output Summary (Last 24 hours) at 04/14/2018 1503 Last data filed at 04/14/2018 0900 Gross per 24 hour  Intake 1090 ml  Output 2200 ml  Net -1110 ml   Filed Weights   04/11/18 0918  Weight: (!) 145.2 kg (320 lb)   Examination: Physical Exam:  Constitutional: Well-nourished, well-developed African-American male who is currently in no acute distress however is sitting on the floor with his feet up on the bench when I walked into the room wearing just a bed sheet around him. Eyes: Sclera anicteric.  Lids and conjunctival are normal. ENMT: External ears and nose appear  normal.  Ears are moist Neck: Supple with no JVD Respiratory: Diminished to auscultation bilaterally with left being worse than right.  Is not wearing any supplemental oxygen via nasal cannula and has unlabored breathing however still complains of some dyspnea. Cardiovascular: Regular rate and rhythm with no appreciable murmurs, rubs, gallops.  No lower extremity edema noted Abdomen: Soft, nontender, distended slightly body habitus.  Bowel sounds present in all 4 quadrants GU: Deferred Musculoskeletal: No contractures or cyanosis.  No joint  deformities Skin: Skin is warm and dry with no appreciable rashes on limited skin evaluation.  Has lower extremity skin lesions on his shins from moving boxes. Neurologic: Cranial nerves II through XII grossly intact no appreciable focal deficits Psychiatric: Awake and alert and is oriented x3.  Slightly anxious mood and affect and is exhibiting odd behaviors by sitting on the floor  Data Reviewed: I have personally reviewed following labs and imaging studies  CBC: Recent Labs  Lab 04/11/18 0947 04/11/18 1717 04/12/18 0541 04/13/18 0540 04/14/18 0511  WBC 10.2 8.7 7.3 4.2 4.3  NEUTROABS 7.5  --  5.3 2.7 3.5  HGB 13.3 12.0* 11.4* 11.1* 10.7*  HCT 38.0* 34.9* 33.4* 33.0* 31.6*  MCV 86.4 87.0 88.6 88.9 87.3  PLT 224 229 207 210 861   Basic Metabolic Panel: Recent Labs  Lab 04/11/18 1025 04/11/18 1717 04/12/18 0541 04/13/18 0540 04/13/18 1354 04/14/18 0511 04/14/18 0540  NA 124*  --  132* 134* 132*  --  130*  K 3.6  --  4.1 3.8 4.1  --  4.5  CL 92*  --  98* 99* 100*  --  99*  CO2 20*  --  26 26 24   --  21*  GLUCOSE 426*  --  309* 267* 351*  --  390*  BUN 13  --  15 15 17   --  22*  CREATININE 1.27* 1.29* 1.11 1.00 0.99  --  0.99  CALCIUM 8.2*  --  8.3* 8.3* 8.5*  --  8.5*  MG  --   --  1.7 1.8  --  1.9  --   PHOS  --   --  1.7* 2.6  --  3.9  --    GFR: Estimated Creatinine Clearance: 137.6 mL/min (by C-G formula based on SCr of 0.99  mg/dL). Liver Function Tests: Recent Labs  Lab 04/11/18 1025 04/12/18 0541 04/13/18 0540 04/13/18 1354 04/14/18 0540  AST 37 52* 96* 114* 67*  ALT 34 45 82* 103* 96*  ALKPHOS 57 46 51 62 65  BILITOT 2.2* 1.7* 0.9 1.4* 0.9  PROT 7.6 6.9 6.6 6.7 6.6  ALBUMIN 3.6 3.1* 2.9* 2.8* 2.7*   No results for input(s): LIPASE, AMYLASE in the last 168 hours. No results for input(s): AMMONIA in the last 168 hours. Coagulation Profile: No results for input(s): INR, PROTIME in the last 168 hours. Cardiac Enzymes: Recent Labs  Lab 04/11/18 1717 04/11/18 2250 04/12/18 0541  TROPONINI <0.03 <0.03 <0.03   BNP (last 3 results) No results for input(s): PROBNP in the last 8760 hours. HbA1C: Recent Labs    04/12/18 0541  HGBA1C 9.9*   CBG: Recent Labs  Lab 04/13/18 1216 04/13/18 1656 04/13/18 2010 04/14/18 0742 04/14/18 1152  GLUCAP 272* 374* 435* 385* 365*   Lipid Profile: No results for input(s): CHOL, HDL, LDLCALC, TRIG, CHOLHDL, LDLDIRECT in the last 72 hours. Thyroid Function Tests: Recent Labs    04/11/18 1717  TSH 1.612   Anemia Panel: No results for input(s): VITAMINB12, FOLATE, FERRITIN, TIBC, IRON, RETICCTPCT in the last 72 hours. Sepsis Labs: Recent Labs  Lab 04/11/18 6837 04/11/18 1151 04/11/18 1717 04/11/18 1946  PROCALCITON  --   --  0.87  --   LATICACIDVEN 2.16* 1.53 1.4 1.7    Recent Results (from the past 240 hour(s))  Blood Culture (routine x 2)     Status: None (Preliminary result)   Collection Time: 04/11/18  9:47 AM  Result Value Ref Range Status   Specimen Description  Final    BLOOD LEFT FOREARM Performed at [facility], [address] [place], [place] [address]    Special Requests   Final    BOTTLES DRAWN AEROBIC AND ANAEROBIC Blood Culture adequate volume   Culture   Final    NO GROWTH 2 DAYS Performed at [facility], [address], [place], [place] [address]    Report Status PENDING  Incomplete  Rapid Strep  Screen ([facility] ONLY)     Status: None   Collection Time: 04/11/18  9:56 AM  Result Value Ref Range Status   Streptococcus, Group A Screen (Direct) NEGATIVE NEGATIVE Final    Comment: (NOTE) A Rapid Antigen test may result negative if the antigen level in the sample is below the detection level of this test. The FDA has not cleared this test as a stand-alone test therefore the rapid antigen negative result has reflexed to a Group A Strep culture. Performed at [facility], [address] [place], [place] [address]   Culture, group A strep     Status: None   Collection Time: 04/11/18  9:56 AM  Result Value Ref Range Status   Specimen Description   Final    THROAT Performed at [facility], [address] [place], [place] [address]    Special Requests   Final    NONE Reflexed from [id number] Performed at [facility], [address] [place], [place] [address]    Culture   Final    NO GROUP A STREP (S.PYOGENES) ISOLATED Performed at [facility], [place] [address], [place], [place] [address]    Report Status 04/13/2018 FINAL  Final  Blood Culture (routine x 2)     Status: None (Preliminary result)   Collection Time: 04/11/18 10:00 AM  Result Value Ref Range Status   Specimen Description   Final    BLOOD RIGHT ARM Performed at [facility], [address] [place], [place] [address]    Special Requests   Final    BOTTLES DRAWN AEROBIC AND ANAEROBIC Blood Culture adequate volume Performed at [facility], [address] [place], [place] [address]    Culture   Final    NO GROWTH 2 DAYS Performed at [facility], [place] [address], [place], [place] [address]    Report Status PENDING  Incomplete  Respiratory Panel by PCR     Status: None   Collection Time: 04/11/18  5:29 PM  Result Value Ref Range Status   Adenovirus NOT DETECTED NOT DETECTED Final   Coronavirus 229E NOT DETECTED NOT DETECTED Final   Coronavirus  HKU1 NOT DETECTED NOT DETECTED Final   Coronavirus NL63 NOT DETECTED NOT DETECTED Final   Coronavirus OC43 NOT DETECTED NOT DETECTED Final   Metapneumovirus NOT DETECTED NOT DETECTED Final   Rhinovirus / Enterovirus NOT DETECTED NOT DETECTED Final   Influenza A NOT DETECTED NOT DETECTED Final   Influenza B NOT DETECTED NOT DETECTED Final   Parainfluenza Virus 1 NOT DETECTED NOT DETECTED Final   Parainfluenza Virus 2 NOT DETECTED NOT DETECTED Final   Parainfluenza Virus 3 NOT DETECTED NOT DETECTED Final   Parainfluenza Virus 4 NOT DETECTED NOT DETECTED Final   Respiratory Syncytial Virus NOT DETECTED NOT DETECTED Final   Bordetella pertussis NOT DETECTED NOT DETECTED Final   Chlamydophila pneumoniae NOT DETECTED NOT DETECTED Final   Mycoplasma pneumoniae NOT  DETECTED NOT DETECTED Final    Comment: Performed at [facility], [place] [address] [place], [place] [address]  Culture, sputum-assessment     Status: None   Collection Time: 04/11/18  7:54 PM  Result Value Ref Range Status   Specimen Description EXPECTORATED SPUTUM  Final   Special Requests NONE  Final   Sputum evaluation   Final    THIS SPECIMEN IS ACCEPTABLE FOR SPUTUM CULTURE Performed at [facility], [place] [address], [place], [place] [address]    Report Status 04/11/2018 FINAL  Final  Culture, respiratory (NON-Expectorated)     Status: None (Preliminary result)   Collection Time: 04/11/18  7:54 PM  Result Value Ref Range Status   Specimen Description   Final    EXPECTORATED SPUTUM Performed at [facility] [address], [place], [place] [address]    Special Requests   Final    NONE Reflexed from [id number] Performed at [facility], [place] [address], [place], [place] [address]    Gram Stain   Final    RARE WBC PRESENT, PREDOMINANTLY PMN RARE GRAM POSITIVE COCCI RARE GRAM POSITIVE RODS    Culture   Final    CULTURE REINCUBATED FOR BETTER GROWTH Performed at  [facility], [place] [address], [place], [place] [address]    Report Status PENDING  Incomplete    Radiology Studies: Dg Chest Port 1 View  Result Date: 04/14/2018 CLINICAL DATA:  Short of breath.  Follow-up exam. EXAM: PORTABLE CHEST 1 VIEW COMPARISON:  04/13/2018 and older exams. FINDINGS: Left mid to lower lung zone consolidation is without change from the prior study. There are no new areas of lung consolidation. Right lung remains clear. Heart is normal size.  No mediastinal or hilar masses. No convincing pleural effusion.  No pneumothorax. IMPRESSION: 1. No change from the previous day's study. 2. Persistent left mid to lower lung consolidation consistent with pneumonia. Electronically Signed   By: Lajean Manes M.D.   On: 04/14/2018 07:32   Dg Chest Port 1 View  Result Date: 04/13/2018 CLINICAL DATA:  Shortness of breath. EXAM: PORTABLE CHEST 1 VIEW COMPARISON:  04/12/2018 FINDINGS: The cardiomediastinal silhouette is unchanged. Extensive airspace opacity throughout the left mid and lower lung has not significantly changed. The right lung remains clear. No large pleural effusion or pneumothorax is identified. IMPRESSION: Unchanged left lung pneumonia. Electronically Signed   By: Logan Bores M.D.   On: 04/13/2018 07:19   Scheduled Meds: . chlorhexidine  15 mL Mouth Rinse BID  . enoxaparin (LOVENOX) injection  40 mg Subcutaneous Q24H  . guaiFENesin  1,200 mg Oral BID  . insulin aspart  0-15 Units Subcutaneous TID WC  . insulin aspart  0-5 Units Subcutaneous QHS  . insulin aspart  4 Units Subcutaneous TID WC  . insulin glargine  25 Units Subcutaneous Daily  . mouth rinse  15 mL Mouth Rinse q12n4p  . methylPREDNISolone (SOLU-MEDROL) injection  60 mg Intravenous Q12H  . sodium chloride HYPERTONIC  4 mL Nebulization Daily   Continuous Infusions: . azithromycin Stopped (04/13/18 1937)  . cefTRIAXone (ROCEPHIN)  IV Stopped (04/13/18 2145)    LOS: 3 days   Kerney Elbe,  DO [facility] Pager [phone]  If 7PM-7AM, please contact night-coverage [email or web address] Password [facility] 04/14/2018, 3:03 PM

## 2018-04-15 ENCOUNTER — Inpatient Hospital Stay (HOSPITAL_COMMUNITY): Payer: Self-pay

## 2018-04-15 LAB — COMPREHENSIVE METABOLIC PANEL
ALT: 82 U/L — AB (ref 17–63)
AST: 41 U/L (ref 15–41)
Albumin: 2.8 g/dL — ABNORMAL LOW (ref 3.5–5.0)
Alkaline Phosphatase: 77 U/L (ref 38–126)
Anion gap: 7 (ref 5–15)
BUN: 25 mg/dL — AB (ref 6–20)
CHLORIDE: 102 mmol/L (ref 101–111)
CO2: 23 mmol/L (ref 22–32)
CREATININE: 1.01 mg/dL (ref 0.61–1.24)
Calcium: 8.6 mg/dL — ABNORMAL LOW (ref 8.9–10.3)
GFR calc Af Amer: >60 mL/min (ref >60)
GFR calc non Af Amer: >60 mL/min (ref >60)
Glucose, Bld: 395 mg/dL — ABNORMAL HIGH (ref 65–99)
Potassium: 4.6 mmol/L (ref 3.5–5.1)
SODIUM: 132 mmol/L — AB (ref 135–145)
Total Bilirubin: 0.7 mg/dL (ref 0.3–1.2)
Total Protein: 6.5 g/dL (ref 6.5–8.1)

## 2018-04-15 LAB — CBC WITH DIFFERENTIAL/PLATELET
BASOS ABS: 0 10*3/uL (ref 0.0–0.1)
BASOS PCT: 0 %
EOS ABS: 0 10*3/uL (ref 0.0–0.7)
Eosinophils Relative: 0 %
HCT: 32.5 % — ABNORMAL LOW (ref 39.0–52.0)
Hemoglobin: 11.1 g/dL — ABNORMAL LOW (ref 13.0–17.0)
LYMPHS ABS: 1.2 10*3/uL (ref 0.7–4.0)
LYMPHS PCT: 17 %
MCH: 30.3 pg (ref 26.0–34.0)
MCHC: 34.2 g/dL (ref 30.0–36.0)
MCV: 88.8 fL (ref 78.0–100.0)
Monocytes Absolute: 0.5 10*3/uL (ref 0.1–1.0)
Monocytes Relative: 7 %
NEUTROS PCT: 76 %
Neutro Abs: 5.4 10*3/uL (ref 1.7–7.7)
Platelets: 312 10*3/uL (ref 150–400)
RBC: 3.66 MIL/uL — AB (ref 4.22–5.81)
RDW: 12.5 % (ref 11.5–15.5)
WBC: 7.1 10*3/uL (ref 4.0–10.5)

## 2018-04-15 LAB — GLUCOSE, CAPILLARY
GLUCOSE-CAPILLARY: 421 mg/dL — AB (ref 65–99)
Glucose-Capillary: 385 mg/dL — ABNORMAL HIGH (ref 65–99)
Glucose-Capillary: 338 mg/dL — ABNORMAL HIGH (ref 65–99)
Glucose-Capillary: 404 mg/dL — ABNORMAL HIGH (ref 65–99)

## 2018-04-15 LAB — MAGNESIUM: Magnesium: 2 mg/dL (ref 1.7–2.4)

## 2018-04-15 LAB — GLUCOSE, RANDOM: Glucose, Bld: 422 mg/dL — ABNORMAL HIGH (ref 65–99)

## 2018-04-15 LAB — PHOSPHORUS: Phosphorus: 3.9 mg/dL (ref 2.5–4.6)

## 2018-04-15 MED ORDER — INSULIN ASPART 100 UNIT/ML ~~LOC~~ SOLN
10.0000 [IU] | Freq: Once | SUBCUTANEOUS | Status: AC
  Administered 2018-04-15: 10 [IU] via SUBCUTANEOUS

## 2018-04-15 MED ORDER — METHYLPREDNISOLONE SODIUM SUCC 40 MG IJ SOLR: 40.0000 mg | Freq: Every day | INTRAMUSCULAR | Status: DC

## 2018-04-15 MED ORDER — INSULIN GLARGINE 100 UNIT/ML ~~LOC~~ SOLN
36.0000 [IU] | Freq: Every day | SUBCUTANEOUS | Status: DC
  Administered 2018-04-16: 36 [IU] via SUBCUTANEOUS
  Filled 2018-04-15: qty 0.36

## 2018-04-15 MED ORDER — SODIUM CHLORIDE 0.9 % IV SOLN
2.0000 g | INTRAVENOUS | Status: DC
  Administered 2018-04-15: 2 g via INTRAVENOUS
  Filled 2018-04-15: qty 2
  Filled 2018-04-15: qty 20

## 2018-04-15 MED ORDER — FUROSEMIDE 10 MG/ML IJ SOLN
40.0000 mg | Freq: Once | INTRAMUSCULAR | Status: AC
  Administered 2018-04-15: 40 mg via INTRAVENOUS
  Filled 2018-04-15: qty 4

## 2018-04-15 MED ORDER — INSULIN ASPART 100 UNIT/ML ~~LOC~~ SOLN
10.0000 [IU] | Freq: Three times a day (TID) | SUBCUTANEOUS | Status: DC
  Administered 2018-04-15 – 2018-04-16 (×3): 10 [IU] via SUBCUTANEOUS

## 2018-04-15 MED ORDER — INSULIN GLARGINE 100 UNIT/ML ~~LOC~~ SOLN
32.0000 [IU] | Freq: Every day | SUBCUTANEOUS | Status: DC
  Administered 2018-04-15: 32 [IU] via SUBCUTANEOUS
  Filled 2018-04-15: qty 0.32

## 2018-04-15 MED ORDER — ENOXAPARIN SODIUM 80 MG/0.8ML ~~LOC~~ SOLN
0.5000 mg/kg | SUBCUTANEOUS | Status: DC
  Administered 2018-04-15: 75 mg via SUBCUTANEOUS
  Filled 2018-04-15: qty 0.8

## 2018-04-15 MED ORDER — HYDRALAZINE HCL 20 MG/ML IJ SOLN
10.0000 mg | Freq: Four times a day (QID) | INTRAMUSCULAR | Status: DC | PRN
  Administered 2018-04-15: 10 mg via INTRAVENOUS
  Filled 2018-04-15: qty 1

## 2018-04-15 MED ORDER — INSULIN ASPART 100 UNIT/ML ~~LOC~~ SOLN
0.0000 [IU] | Freq: Three times a day (TID) | SUBCUTANEOUS | Status: DC
  Administered 2018-04-15: 15 [IU] via SUBCUTANEOUS
  Administered 2018-04-15 (×2): 20 [IU] via SUBCUTANEOUS
  Administered 2018-04-16: 11 [IU] via SUBCUTANEOUS
  Administered 2018-04-16: 7 [IU] via SUBCUTANEOUS

## 2018-04-15 MED ORDER — INSULIN ASPART 100 UNIT/ML ~~LOC~~ SOLN: 0.0000 [IU] | Freq: Every day | SUBCUTANEOUS | Status: DC

## 2018-04-15 MED ORDER — LOSARTAN POTASSIUM 50 MG PO TABS
100.0000 mg | ORAL_TABLET | Freq: Every day | ORAL | Status: DC
  Administered 2018-04-15 – 2018-04-16 (×2): 100 mg via ORAL
  Filled 2018-04-15 (×2): qty 2

## 2018-04-15 NOTE — Progress Notes (Signed)
SATURATION QUALIFICATIONS: (This note is used to comply with regulatory documentation for home oxygen)  Patient Saturations on Room Air at Rest = 99%  Patient Saturations on Room Air while Ambulating = 97%  Patient Saturations on NA Liters of oxygen while Ambulating = NA%  Please briefly explain why patient needs home oxygen: Pt does not qualify

## 2018-04-15 NOTE — Progress Notes (Signed)
Inpatient Diabetes Program Recommendations  AACE/ADA: New Consensus Statement on Inpatient Glycemic Control (2015)  Target Ranges:  Prepandial:   less than 140 mg/dL      Peak postprandial:   less than 180 mg/dL (1-2 hours)      Critically ill patients:  140 - 180 mg/dL   Lab Results  Component Value Date   GLUCAP 385 (H) 04/15/2018   HGBA1C 9.9 (H) 04/12/2018    Review of Glycemic Control  Blood sugars continue to run above goal. Continues on steroids. Needs insulin adjustment.  Inpatient Diabetes Program Recommendations:     Increase Lantus to 36 units QAM Increase Novolog to 10 units tidwc.  Will still need affordable insulin at discharge.  Thank you. William Marks, RD, LDN, CDE Inpatient Diabetes Coordinator [phone]

## 2018-04-15 NOTE — Progress Notes (Signed)
PROGRESS NOTE    William Marks  KXF:[id number] DOB: [birth year] DOA: 04/11/2018 PCP: Patient, No Pcp Per   Brief Narrative:  William Marks is a 52 y.o. 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   Patient states symptoms started about 2 weeks ago and about 10 days ago he started having some nausea and vomiting daily and then developed some diarrhea on and off with 2-3 loose and sometimes watery movements a day.  Last bowel movement was day before admission and was nonbloody and emesis is also nonbloody.  Patient also admitted to having some dizziness and states that he developed progressively worsening shortness of breath and states that even when he lays flat  he is short of breath.  Denies any coughing up any sputum or any urinary complaints.  Denies any dysuria but did admit to having some abdominal cramps and pain on the left side of his abdomen.  Patient also complains of a on and off headache with dizziness but denies any chest pain, flank pain, blurred vision or double vision.  Patient states that for the last 4 days he has been trying to call over to come to the hospital but was unable to because of feeling sick and finally was able to and presented to the [facility] where he was transferred to [facility] long hospital for admission for sepsis secondary to community-acquired lingular pneumonia.  TRH was called to admit this patient for the above complaints.  In the ED sepsis protocol was initiated and patient had a chest x-ray as well as a CT of the abdomen and pelvis which revealed patient having a lingular consolidation.  Patient was placed on IV antibiotics and given 2.8 L fluid resuscitation boluses and had basic blood work done.  He was transferred to [facility] for admission and is currently still being treated for his Left Sided Lingular  Consolidation. Patient was not improving significantly so IV Steroids were started along with Hypertonic Saline Nebs. Today has been the first day since he has been afebrile  Patient was exhibiting odd and inappropriate behavior yesterday per nursing by sitting on the floor of his room, walking out in the hallway and stripping his clothes, along with other behaviors so Psychiatry was consulted for further evaluation and stated that no acute Psychiatric Intervention was warranted until hyponatremia is resolved.   CT of Chest Obtained to evaluate Lingular Consolidation. Discussed Case with Pulmonary NP, Salvadore Dom who recommended giving a dose of IV Lasix today, continuing current Abx Regimen, and repeating CXR in AM.   Assessment & Plan:   Principal Problem:   Adjustment disorder, unspecified Active Problems:   Sepsis due to pneumonia ([place])   Lingular pneumonia   Dyspnea   Essential hypertension   Diabetes mellitus type 2 in obese ([place])   AKI (acute kidney injury) ([place])   Hyponatremia   Hepatic steatosis   Hyperbilirubinemia   Lactic acidosis   Microscopic hematuria   Tobacco abuse  Sepsis 2/2 to Community Acquired Lingular Pneumonia -Admitte to inpatient telemetry -On admission patient met sepsis criteria and was febrile with temperature 103.2, a pulse rate of 117, respiratory rate of 32, lactic acid level 2.16 -Sepsis protocol started in the ED and patient was given 2800 mL of Lactated Ringer boluses; Given 1 more Liter Bolus and started on IVF Maintenance that has now been discontinued.  -  Lactic acid level was 2.16 on admission and then trended down to 1.53 -Checked Procalcitonin and was 0.97 -Sepsis Physiology improving however patient continued to spike temperatures; Today is the first day he has been Afebrile  -Started empiric antibiotics and patient received IV vancomycin and IV Zosyn however will change to IV Ceftriaxone and IV Azithromycin given that this is a  community-acquired pneumonia -Admission Chest x-ray showed prominent left midlung field infiltrate consistent with pneumonia and CT scan of the abdomen and pelvis showed a lingular consolidation -Repeat CXR this AM showed: No change from the previous day's study and had persistent left mid to lower lung consolidation consistent with pneumonia -Continue with duo nebs 3 mL every 6 scheduled and albuterol every 2 as needed -Added Hypertonic Saline Nebs to regimen -Started Mucinex 1200 g p.o. twice daily, flutter valve, and incentive spirometer -Added IV Steroids with Solumedrol 60 mg q12 as patient was not significantly improving; Will now wean to 40 mg q12h and 40 mg Daily  -Blood cultures x2 obtained in the ED and will obtain a sputum culture -Blood Cx x2 showed No Growth at 4 Days  -Sputum Gram Stain showed Rare WBC, Predominantly PMN; Rare Gram Positive Cocci, Rare Gram Positive Rods with Cx consistent with Normal Respiratory Flora  -Strep Pneumo Urinary Ag Negative and Legionella Urine Ag Negative  -Urinalysis was not really consistent with a urinary tract infection but will still send urinary culture -Follow-up culture results and repeat CBC in the a.m. -Influenza A/B and Respiratory Virus Panel Negative so D/C'd Droplet Precautions -Antipyretics with Acetaminophen every 6 as needed and then added po Motrin 400 mg q6hprn for Fever as well  -Discontinued Telemetry as patient was not able to keep it on -Patient felt dyspneic so was started on Supplemental O2 via [place] for comfort even though he was saturating 99% on Room Air   -Obtained CT Chest w/o Contrast to evaluate PNA further and showed Lingular infiltrate increased from prior CT examination but relatively stable from the prior chest x-ray. Changes of prior granulomatous disease. Aortic Atherosclerosis noted. -I discussed the case with Pulmonary nurse practitioner Marni Griffon who recommended continuing current antibiotic regimen course for at  least 10 days, giving IV Lasix 40 mg x 1, and repeating a chest x-ray in the a.m.  Per my conversation with Salvadore Dom, if patient's CXR is not stable compared to prior and worsening, pulmonology will evaluate the need for a formal consultation. -Home Ambulatory screen done and patient did not desaturate  Dyspnea -Likely from above however needed to rule out a cardiac etiology  -We will cycle patient's cardiac troponins and check an Echocardiogram -Cardiac Troponin <0.03 x3; ECHOCardiogram showed EF of 60-65% with Grade 1 DD -Repeat CXR showed Worsening PNA and was unchanged; CT Chest done and as above -Pulmonary recommending -As above  Nausea/Vomiting/Diarrhea, improved  -? From Viral illness causing Pneumonia -Supportive Care and Antiemetics -Stopped IVF Hydration as above and placed on Heart Healthy/Carb Modified Diet but changed it to just Carb Modified tody   Essential Hypertension -Was Running Soft Blood Pressures  -Hold patient's home losartan-hydrochlorothiazide combination low blood pressures and AKI -IVF Hydration discontinued now -Resume Home Losartan 100 mg po Daily; Hold HCTZ -Given 1x of IV Lasix -Also Added IV Hydralazine 10 mg q6hprn for SBP>170 or DBP>100 -Continue to monitor vital signs per protocol  Diabetes Mellitus Type 2 -Hold patient's metformin at thousand milligrams p.o. twice daily along with glimepiride 4 mg p.o. Daily -Checked Hemoglobin A1c and was  9.9 -Placed on Moderate NovoLog sliding scale AC and at bedtime and also started Lantus 25 units sq Daily at the recommendation of the Diabetes Education Coordinator along with 4 units of Novolog TIDwm -Lantus now increased to 36 units sq Daily and increased 4 units of Novolog TIDwm to 10 units TIDwm along with increasing to Resistant Novolog SSI AC/HS -Continue to monitor CBGs; CBG's ranging from 338-365;  -Blood Sugars likely elevated in IV Steroid Use and will De-escalate IV Steroids  -Diabetes  Education Coordinator Consulted for further evaluation and recommendations  -Will need Insulin at Discharge and Diabetes Education Coordinator recommending Novolin 70/30 15-20 units BID but may need to increase   Acute Kidney Injury but suspecting some form of CKD (Likely Stage 2) -Unknown baseline at this time -Likely prerenal in the setting of nausea, vomiting, diarrhea, as well as losartan hydrochlorothiazide combination -Avoid nephrotoxic medications and Hold Losartan-HCTZ -Continue with IV fluid hydration with normal saline at rate of 75 mL/per hour -BUN/Cr stable now at 25/1.01 -Continue to monitor and repeat CMP in a.m.  Hyponatremia/Pseudohyponatremia -In the Setting of hyperglycemia and dehydration from sepsis nausea and vomiting as well as diarrhea -Corrected sodium was 132 on admission -Now Sodium value went up from 124 -> 134 but was 132 today (Blood Sugar was 395) -Corrected Sodium was 137 according to [employer]  -Discontinued IV fluid hydration as above -Repeat CMP in a.m.  Diffuse Hepatic Steatosis/Abnormal LFTs -On CT of the abdomen and pelvis with contrast -Checked CMP and LFTs showed AST of 37 and ALT of 34 on admission but AST is now 41 and LT is now 34 -Patient stated that he drinks alcohol occasionally and does not drink every day; Diet Education given  -Checked Right Upper quadrant ultrasound showed No acute findings. No bile duct dilatation. No evidence of cholecystitis. Fatty infiltration of the liver. -Check Acute Hepatitis Panel  -Repeat CMP in a.m.  Hyperbilirubinemia, improved -Patient's T bili was 2.2 on Admission now trended down to 0.7 -Right upper quadrant ultrasound as above -Continue to monitor and trend and repeat CMP in the a.m.  Lactic Acidosis, improved  -In the setting of sepsis and metformin -We will hold metformin and continue IV fluid hydration -Lactic acid level improved and went from 2.16 and is now 1.7  Microscopic  Hematuria -History of nephrolithiasis per patient -On admission patient's urinalysis showed a clear appearance large hemoglobin on his urine dipstick and 6-10 red blood cells per high-powered field -Current urine culture is pending to be sent -Repeat urinalysis yesterday did again show Moderate Hb and >500 Glucose -Hb/Hct stable  -Will need outpatient follow-up with Urology  Tobacco Abuse -Patient states he has not been smoking over a month however he has smoked since the age of 27 and trying to quit -Smoking cessation counseling provided   ? Right Renal Artery Aneurysm -Follow up with [facility] in 1-2 years as an outpatient   Hypophosphatemia -Patient's Phos Level was 1.7 and improved to 3.9 -Continue to monitor and replete as necessary -Repeat phosphorus level in a.m.  ?Bipolar/Inappropirate Behavior -He was sitting on the floor of his room multiple times -Also walking out in the hallway and stripping his clothes, along with other behaviors  -? Steroid Induced Psychosis  -Psychiatry was consulted for further evaluation and stated that no acute Psychiatric Intervention was warranted until hyponatremia is resolved however he has a normal corrected Sodium at 137 -Continue to Monitor these behaviors and if worsening will re-consult Psychiatry in AM -Behaviors have  improved   Normocytic Anemia -Patient's Hb/Hct stable at 11.1/32.5 -Likely from suspected CKD -Check Anemia Panel in AM -Continue to Monitor for S/Sx of Bleeding -Repeat CBC in AM   DVT prophylaxis: Enoxaparin 40 mg sq q24h Code Status: FULL CODE Family Communication: No family present at bedside  Disposition Plan: Remain Inpatient for continued Workup and Treatment for PNA  Consultants:   Psychiatry  Discussed the case with Pulmonary NP, Salvadore Dom    Procedures:  ECHOCARDIOGRAM ------------------------------------------------------------------- Study Conclusions  - Left ventricle: The cavity size was  normal. Wall thickness was   increased in a pattern of mild LVH. Systolic function was normal.   The estimated ejection fraction was in the range of 60% to 65%.   Wall motion was normal; there were no regional wall motion   abnormalities. Doppler parameters are consistent with abnormal   left ventricular relaxation (grade 1 diastolic dysfunction). - Left atrium: The atrium was mildly dilated.  Antimicrobials:  Anti-infectives (From admission, onward)   Start     Dose/Rate Route Frequency Ordered Stop   04/15/18 1500  cefTRIAXone (ROCEPHIN) 2 g in sodium chloride 0.9 % 100 mL IVPB     2 g 200 mL/hr over 30 Minutes Intravenous Every 24 hours 04/15/18 0958 04/18/18 1459   04/11/18 2000  cefTRIAXone (ROCEPHIN) 1 g in sodium chloride 0.9 % 100 mL IVPB  Status:  Discontinued     1 g 200 mL/hr over 30 Minutes Intravenous Every 24 hours 04/11/18 1706 04/15/18 0958   04/11/18 1830  piperacillin-tazobactam (ZOSYN) IVPB 3.375 g  Status:  Discontinued     3.375 g 12.5 mL/hr over 240 Minutes Intravenous Every 8 hours 04/11/18 0950 04/11/18 1706   04/11/18 1800  azithromycin (ZITHROMAX) 500 mg in sodium chloride 0.9 % 250 mL IVPB     500 mg 250 mL/hr over 60 Minutes Intravenous Every 24 hours 04/11/18 1706 04/18/18 1459   04/11/18 1130  vancomycin (VANCOCIN) IVPB 1000 mg/200 mL premix     1,000 mg 200 mL/hr over 60 Minutes Intravenous  Once 04/11/18 0948 04/11/18 1242   04/11/18 1000  vancomycin (VANCOCIN) IVPB 1000 mg/200 mL premix     1,000 mg 200 mL/hr over 60 Minutes Intravenous  Once 04/11/18 0948 04/11/18 1131   04/11/18 0945  piperacillin-tazobactam (ZOSYN) IVPB 3.375 g     3.375 g 100 mL/hr over 30 Minutes Intravenous  Once 04/11/18 0937 04/11/18 1007   04/11/18 0945  vancomycin (VANCOCIN) 2,000 mg in sodium chloride 0.9 % 500 mL IVPB  Status:  Discontinued     2,000 mg 250 mL/hr over 120 Minutes Intravenous  Once 04/11/18 0941 04/11/18 0947     Subjective: Seen and examined was much  more appropriate today.  States he still felt slightly short of breath and is unable to cough up sputum but denies any chest pain, lightheadedness or dizziness.  Wanting to know when he can go home.  No other concerns or complaints at this time  Objective: Vitals:   04/14/18 2351 04/15/18 0444 04/15/18 0833 04/15/18 1116  BP: (!) 151/87  (!) 161/108 (!) 151/78  Pulse: 77 77 62 72  Resp: _0 Temp: 98.9 F (37.2 C) 98.8 F (37.1 C) 97.8 F (36.6 C) 98 F (36.7 C)  TempSrc: Oral Oral Oral Oral  SpO2: 97% 100% 99% 97%  Weight:      Height:        Intake/Output Summary (Last 24 hours) at 04/15/2018 1406 Last data filed  at 04/15/2018 0700 Gross per 24 hour  Intake 240 ml  Output 1000 ml  Net -760 ml   Filed Weights   04/11/18 0918  Weight: (!) 145.2 kg (320 lb)   Examination: Physical Exam:  Constitutional: Well-nourished, well-developed obese African-American male who is currently in no acute distress laying in bed and is more appropriate behavior wise today he was yesterday.  He states he feels better and was wanting to know when he can go home. Eyes: Sclera are anicteric.  Lids and conjunctive are normal. ENMT: External ears and nose appear normal.  Grossly normal hearing Neck: Supple no JVD Respiratory: Diminished to auscultation specifically on the left side compared to the right.  Patient is not tachypneic or using accessory muscles to breathe and no appreciable wheezing, rales but did have some rhonchi.  Unlabored breathing Cardiovascular: Regular rate and rhythm.  With no appreciable murmurs, rubs, gallops.  No lower extremity edema noted Abdomen: Soft, nontender, distended secondary body habitus.  Bowel sounds present all 4 quadrants GU: Deferred Musculoskeletal: No contractures or cyanosis.  No joint deformities noted Skin: Skin is warm and dry with no appreciable rashes on limited skin evaluation.  Has lower extremity skin lesions from moving boxes Neurologic:  Cranial nerves II through XII grossly intact no appreciable focal deficits. Psychiatric: Is awake and alert and oriented x3.  Normal mood and affect today and is not exhibiting odd behavior he is exhibiting yesterday.  Data Reviewed: I have personally reviewed following labs and imaging studies  CBC: Recent Labs  Lab 04/11/18 0947 04/11/18 1717 04/12/18 0541 04/13/18 0540 04/14/18 0511 04/15/18 0522  WBC 10.2 8.7 7.3 4.2 4.3 7.1  NEUTROABS 7.5  --  5.3 2.7 3.5 5.4  HGB 13.3 12.0* 11.4* 11.1* 10.7* 11.1*  HCT 38.0* 34.9* 33.4* 33.0* 31.6* 32.5*  MCV 86.4 87.0 88.6 88.9 87.3 88.8  PLT 224 229 207 210 255 494   Basic Metabolic Panel: Recent Labs  Lab 04/12/18 0541 04/13/18 0540 04/13/18 1354 04/14/18 0511 04/14/18 0540 04/15/18 0522  NA 132* 134* 132*  --  130* 132*  K 4.1 3.8 4.1  --  4.5 4.6  CL 98* 99* 100*  --  99* 102  CO2 _0 --  21* 23  GLUCOSE 309* 267* 351*  --  390* 395*  BUN _1 --  22* 25*  CREATININE 1.11 1.00 0.99  --  0.99 1.01  CALCIUM 8.3* 8.3* 8.5*  --  8.5* 8.6*  MG 1.7 1.8  --  1.9  --  2.0  PHOS 1.7* 2.6  --  3.9  --  3.9   GFR: Estimated Creatinine Clearance: 134.9 mL/min (by C-G formula based on SCr of 1.01 mg/dL). Liver Function Tests: Recent Labs  Lab 04/12/18 0541 04/13/18 0540 04/13/18 1354 04/14/18 0540 04/15/18 0522  AST 52* 96* 114* 67* 41  ALT 45 82* 103* 96* 82*  ALKPHOS 46 51 62 65 77  BILITOT 1.7* 0.9 1.4* 0.9 0.7  PROT 6.9 6.6 6.7 6.6 6.5  ALBUMIN 3.1* 2.9* 2.8* 2.7* 2.8*   No results for input(s): LIPASE, AMYLASE in the last 168 hours. No results for input(s): AMMONIA in the last 168 hours. Coagulation Profile: No results for input(s): INR, PROTIME in the last 168 hours. Cardiac Enzymes: Recent Labs  Lab 04/11/18 1717 04/11/18 2250 04/12/18 0541  TROPONINI <0.03 <0.03 <0.03   BNP (last 3 results) No results for input(s): PROBNP in the last 8760 hours. HbA1C: No  results for input(s): HGBA1C in the  last 72 hours. CBG: Recent Labs  Lab 04/14/18 1152 04/14/18 1644 04/14/18 1954 04/15/18 0827 04/15/18 1106  GLUCAP 365* 466* 359* 385* 338*   Lipid Profile: No results for input(s): CHOL, HDL, LDLCALC, TRIG, CHOLHDL, LDLDIRECT in the last 72 hours. Thyroid Function Tests: No results for input(s): TSH, T4TOTAL, FREET4, T3FREE, THYROIDAB in the last 72 hours. Anemia Panel: No results for input(s): VITAMINB12, FOLATE, FERRITIN, TIBC, IRON, RETICCTPCT in the last 72 hours. Sepsis Labs: Recent Labs  Lab 04/11/18 1610 04/11/18 1151 04/11/18 1717 04/11/18 1946  PROCALCITON  --   --  0.87  --   LATICACIDVEN 2.16* 1.53 1.4 1.7    Recent Results (from the past 240 hour(s))  Blood Culture (routine x 2)     Status: None (Preliminary result)   Collection Time: 04/11/18  9:47 AM  Result Value Ref Range Status   Specimen Description   Final    BLOOD LEFT FOREARM Performed at [facility], [address] [place], [place] [address]    Special Requests   Final    BOTTLES DRAWN AEROBIC AND ANAEROBIC Blood Culture adequate volume   Culture   Final    NO GROWTH 4 DAYS Performed at [facility], [place] [address], [place], [place] [address]    Report Status PENDING  Incomplete  Rapid Strep Screen ([facility] ONLY)     Status: None   Collection Time: 04/11/18  9:56 AM  Result Value Ref Range Status   Streptococcus, Group A Screen (Direct) NEGATIVE NEGATIVE Final    Comment: (NOTE) A Rapid Antigen test may result negative if the antigen level in the sample is below the detection level of this test. The FDA has not cleared this test as a stand-alone test therefore the rapid antigen negative result has reflexed to a Group A Strep culture. Performed at [facility], [address] [place], [place] [address]   Culture, group A strep     Status: None   Collection Time: 04/11/18  9:56 AM  Result Value Ref Range Status   Specimen Description   Final     THROAT Performed at [facility], [address] [place], [place] [address]    Special Requests   Final    NONE Reflexed from [id number] Performed at [facility], [address] [place], [place] [address]    Culture   Final    NO GROUP A STREP (S.PYOGENES) ISOLATED Performed at [facility], [place] [address], [place], [place] [address]    Report Status 04/13/2018 FINAL  Final  Blood Culture (routine x 2)     Status: None (Preliminary result)   Collection Time: 04/11/18 10:00 AM  Result Value Ref Range Status   Specimen Description   Final    BLOOD RIGHT ARM Performed at [facility], [address] [place], [place] [address]    Special Requests   Final    BOTTLES DRAWN AEROBIC AND ANAEROBIC Blood Culture adequate volume Performed at [facility], [address] [place], [place] [address]    Culture   Final    NO GROWTH 4 DAYS Performed at [facility], [place] [address], [place],  [address]    Report Status PENDING  Incomplete  Respiratory Panel by PCR     Status: None   Collection Time: 04/11/18  5:29  PM  Result Value Ref Range Status   Adenovirus NOT DETECTED NOT DETECTED Final   Coronavirus 229E NOT DETECTED NOT DETECTED Final   Coronavirus HKU1 NOT DETECTED NOT DETECTED Final   Coronavirus NL63 NOT DETECTED NOT DETECTED Final   Coronavirus OC43 NOT DETECTED NOT DETECTED Final   Metapneumovirus NOT DETECTED NOT DETECTED Final   Rhinovirus / Enterovirus NOT DETECTED NOT DETECTED Final   Influenza A NOT DETECTED NOT DETECTED Final   Influenza B NOT DETECTED NOT DETECTED Final   Parainfluenza Virus 1 NOT DETECTED NOT DETECTED Final   Parainfluenza Virus 2 NOT DETECTED NOT DETECTED Final   Parainfluenza Virus 3 NOT DETECTED NOT DETECTED Final   Parainfluenza Virus 4 NOT DETECTED NOT DETECTED Final   Respiratory Syncytial Virus NOT DETECTED NOT DETECTED Final   Bordetella pertussis NOT DETECTED NOT DETECTED Final    Chlamydophila pneumoniae NOT DETECTED NOT DETECTED Final   Mycoplasma pneumoniae NOT DETECTED NOT DETECTED Final    Comment: Performed at [facility], [place] [address], [place], [place] [address]  Culture, sputum-assessment     Status: None   Collection Time: 04/11/18  7:54 PM  Result Value Ref Range Status   Specimen Description EXPECTORATED SPUTUM  Final   Special Requests NONE  Final   Sputum evaluation   Final    THIS SPECIMEN IS ACCEPTABLE FOR SPUTUM CULTURE Performed at [facility], [place] [address] [place], [place] [address]    Report Status 04/11/2018 FINAL  Final  Culture, respiratory (NON-Expectorated)     Status: None   Collection Time: 04/11/18  7:54 PM  Result Value Ref Range Status   Specimen Description   Final    EXPECTORATED SPUTUM Performed at [place] [address] [place], [place] [address]    Special Requests   Final    NONE Reflexed from [phone] [id number] Performed at [facility], [place] [address] [place], [place] [address]    Gram Stain   Final    RARE WBC PRESENT, PREDOMINANTLY PMN RARE GRAM POSITIVE COCCI RARE GRAM POSITIVE RODS    Culture   Final    Consistent with normal respiratory flora. Performed at [facility], [place] [address], [place], [place] [address]    Report Status 04/14/2018 FINAL  Final    Radiology Studies: Ct Chest Wo Contrast  Result Date: 04/15/2018 CLINICAL DATA:  Follow-up lingular infiltrate EXAM: CT CHEST WITHOUT CONTRAST TECHNIQUE: Multidetector CT imaging of the chest was performed following the standard protocol without IV contrast. COMPARISON:  04/11/2018 CT of the abdomen and pelvis, 04/14/2018 chest x-ray FINDINGS: Cardiovascular: Somewhat limited due to the lack of IV contrast. Atherosclerotic calcifications are noted. The aorta is not significantly dilated no cardiac enlargement is seen. Mild coronary calcifications are noted. Mediastinum/Nodes: Thoracic inlet  is within normal limits. Multiple calcified hilar and mediastinal lymph nodes are noted consistent with prior granulomatous disease. No sizable adenopathy is noted at this time. The esophagus is within normal limits. Lungs/Pleura: The right lung is well aerated without focal infiltrate or sizable effusion. No parenchymal nodules are seen. The left lung demonstrates diffuse lingular and upper lobe infiltrate with peribronchial thickening consistent with acute pneumonia. This appears increased when compared with the prior CT of the abdomen and pelvis but relatively stable from recent chest x-rays. No sizable left effusion is noted. Upper Abdomen: Visualized upper abdomen is unremarkable. Musculoskeletal: Degenerative changes of the thoracic spine are seen. IMPRESSION: Lingular infiltrate increased from prior  CT examination but relatively stable from the prior chest x-ray. Continued follow-up is recommended. Changes of prior granulomatous disease. Aortic Atherosclerosis (ICD10-I70.0). Electronically Signed   By: Inez Catalina M.D.   On: 04/15/2018 08:34   Dg Chest Port 1 View  Result Date: 04/14/2018 CLINICAL DATA:  Short of breath.  Follow-up exam. EXAM: PORTABLE CHEST 1 VIEW COMPARISON:  04/13/2018 and older exams. FINDINGS: Left mid to lower lung zone consolidation is without change from the prior study. There are no new areas of lung consolidation. Right lung remains clear. Heart is normal size.  No mediastinal or hilar masses. No convincing pleural effusion.  No pneumothorax. IMPRESSION: 1. No change from the previous day's study. 2. Persistent left mid to lower lung consolidation consistent with pneumonia. Electronically Signed   By: Lajean Manes M.D.   On: 04/14/2018 07:32   Scheduled Meds: . chlorhexidine  15 mL Mouth Rinse BID  . enoxaparin (LOVENOX) injection  0.5 mg/kg Subcutaneous Q24H  . furosemide  40 mg Intravenous Once  . guaiFENesin  1,200 mg Oral BID  . insulin aspart  0-20 Units  Subcutaneous TID WC  . insulin aspart  0-5 Units Subcutaneous QHS  . insulin aspart  10 Units Subcutaneous TID WC  . [START ON 04/16/2018] insulin glargine  36 Units Subcutaneous Daily  . mouth rinse  15 mL Mouth Rinse q12n4p  . methylPREDNISolone (SOLU-MEDROL) injection  40 mg Intravenous Q12H  . sodium chloride HYPERTONIC  4 mL Nebulization Daily   Continuous Infusions: . azithromycin Stopped (04/14/18 1807)  . cefTRIAXone (ROCEPHIN)  IV      LOS: 4 days   Kerney Elbe, DO [facility] Pager [phone]  If 7PM-7AM, please contact night-coverage [email or web address] Password [facility] 04/15/2018, 2:06 PM

## 2018-04-15 NOTE — Progress Notes (Signed)
Inpatient Diabetes Program Recommendations  AACE/ADA: New Consensus Statement on Inpatient Glycemic Control (2015)  Target Ranges:  Prepandial:   less than 140 mg/dL      Peak postprandial:   less than 180 mg/dL (1-2 hours)      Critically ill patients:  140 - 180 mg/dL   Lab Results  Component Value Date   GLUCAP 404 (H) 04/15/2018   HGBA1C 9.9 (H) 04/12/2018    Review of Glycemic Control  CBGs today - 385, 338, 404. Called RN regarding his blood sugars and po intake. RN states [place] [facility] [employer] [place] and he's going to do what he wants to do. Received Novolog 30 units for blood sugar of 404 mg/dL. On Solumedrol 40 mg IV daily.  Continue to educate patient on why he needs insulin at home.   Continue to follow.  Thank you. Ailene Ardshonda Namya Voges, RD, LDN, CDE Inpatient Diabetes Coordinator [phone]

## 2018-04-16 ENCOUNTER — Inpatient Hospital Stay (HOSPITAL_COMMUNITY): Payer: Self-pay

## 2018-04-16 DIAGNOSIS — F4320 Adjustment disorder, unspecified: Secondary | ICD-10-CM

## 2018-04-16 LAB — CULTURE, BLOOD (ROUTINE X 2)
CULTURE: NO GROWTH
Culture: NO GROWTH
SPECIAL REQUESTS: ADEQUATE
SPECIAL REQUESTS: ADEQUATE

## 2018-04-16 LAB — CBC WITH DIFFERENTIAL/PLATELET
BASOS ABS: 0 10*3/uL (ref 0.0–0.1)
BASOS PCT: 0 %
EOS ABS: 0.1 10*3/uL (ref 0.0–0.7)
Eosinophils Relative: 1 %
HCT: 33.4 % — ABNORMAL LOW (ref 39.0–52.0)
HEMOGLOBIN: 11 g/dL — AB (ref 13.0–17.0)
LYMPHS ABS: 2.5 10*3/uL (ref 0.7–4.0)
Lymphocytes Relative: 38 %
MCH: 29.4 pg (ref 26.0–34.0)
MCHC: 32.9 g/dL (ref 30.0–36.0)
MCV: 89.3 fL (ref 78.0–100.0)
Monocytes Absolute: 0.7 10*3/uL (ref 0.1–1.0)
Monocytes Relative: 11 %
NEUTROS PCT: 50 %
Neutro Abs: 3.4 10*3/uL (ref 1.7–7.7)
Platelets: 405 10*3/uL — ABNORMAL HIGH (ref 150–400)
RBC: 3.74 MIL/uL — AB (ref 4.22–5.81)
RDW: 12.4 % (ref 11.5–15.5)
WBC: 6.7 10*3/uL (ref 4.0–10.5)

## 2018-04-16 LAB — COMPREHENSIVE METABOLIC PANEL
ALK PHOS: 66 U/L (ref 38–126)
ALT: 61 U/L — AB (ref 0–44)
AST: 51 U/L — AB (ref 15–41)
Albumin: 2.7 g/dL — ABNORMAL LOW (ref 3.5–5.0)
Anion gap: 8 (ref 5–15)
BUN: 23 mg/dL — ABNORMAL HIGH (ref 6–20)
CALCIUM: 8.5 mg/dL — AB (ref 8.9–10.3)
CO2: 24 mmol/L (ref 22–32)
Chloride: 101 mmol/L (ref 98–111)
Creatinine, Ser: 1.02 mg/dL (ref 0.61–1.24)
GFR calc Af Amer: >60 mL/min (ref >60)
GFR calc non Af Amer: >60 mL/min (ref >60)
Glucose, Bld: 284 mg/dL — ABNORMAL HIGH (ref 70–99)
Potassium: 4.7 mmol/L (ref 3.5–5.1)
SODIUM: 133 mmol/L — AB (ref 135–145)
Total Bilirubin: 1.4 mg/dL — ABNORMAL HIGH (ref 0.3–1.2)
Total Protein: 6.1 g/dL — ABNORMAL LOW (ref 6.5–8.1)

## 2018-04-16 LAB — HEPATITIS PANEL, ACUTE
Hep A IgM: NEGATIVE
Hep B C IgM: NEGATIVE
Hepatitis B Surface Ag: NEGATIVE

## 2018-04-16 LAB — PHOSPHORUS: Phosphorus: 4.4 mg/dL (ref 2.5–4.6)

## 2018-04-16 LAB — MAGNESIUM: Magnesium: 2 mg/dL (ref 1.7–2.4)

## 2018-04-16 LAB — GLUCOSE, CAPILLARY
Glucose-Capillary: 246 mg/dL — ABNORMAL HIGH (ref 70–99)
Glucose-Capillary: 257 mg/dL — ABNORMAL HIGH (ref 70–99)

## 2018-04-16 MED ORDER — PEN NEEDLES 31G X 5 MM MISC: 1.0000 | Freq: Two times a day (BID) | 0 refills | Status: AC

## 2018-04-16 MED ORDER — CEFDINIR 300 MG PO CAPS
300.0000 mg | ORAL_CAPSULE | Freq: Two times a day (BID) | ORAL | Status: DC
  Administered 2018-04-16: 300 mg via ORAL
  Filled 2018-04-16: qty 1

## 2018-04-16 MED ORDER — AZITHROMYCIN 250 MG PO TABS
500.0000 mg | ORAL_TABLET | Freq: Every day | ORAL | Status: DC
  Administered 2018-04-16: 500 mg via ORAL
  Filled 2018-04-16: qty 2

## 2018-04-16 MED ORDER — PREDNISONE 10 MG (21) PO TBPK: ORAL_TABLET | ORAL | 0 refills | Status: AC

## 2018-04-16 MED ORDER — AZITHROMYCIN 250 MG PO TABS: ORAL_TABLET | ORAL | 0 refills | Status: AC

## 2018-04-16 MED ORDER — INSULIN ISOPHANE & REGULAR (HUMAN 70-30)100 UNIT/ML KWIKPEN: 20.0000 [IU] | PEN_INJECTOR | Freq: Two times a day (BID) | SUBCUTANEOUS | 11 refills | Status: AC

## 2018-04-16 MED ORDER — CEFDINIR 300 MG PO CAPS: 300.0000 mg | ORAL_CAPSULE | Freq: Two times a day (BID) | ORAL | 0 refills | Status: AC

## 2018-04-16 MED ORDER — BLOOD GLUCOSE MONITOR KIT: PACK | 0 refills | Status: AC

## 2018-04-16 MED ORDER — GUAIFENESIN ER 600 MG PO TB12: 1200.0000 mg | ORAL_TABLET | Freq: Two times a day (BID) | ORAL | 0 refills | Status: AC

## 2018-04-16 NOTE — Care Management Note (Signed)
Case Management Note  Patient Details  Name: Edmonia LynchRudolph Hartline MRN: [id number] Date of Birth: 10/11/1966  Subjective/Objective:                  [employer] match program performed for insulin/ good rx card given for future medication needs/states that he does have a pcp ti follow up with.  Action/Plan: Match program and medication assistance  Expected Discharge Date:  (unknown)               Expected Discharge Plan:  Home/Self Care  In-House Referral:     Discharge planning Services  CM Consult, MATCH Program, Medication Assistance  Post Acute Care Choice:    Choice offered to:     DME Arranged:    DME Agency:     HH Arranged:    HH Agency:     Status of Service:  Completed, signed off  If discussed at [employer] Length of Stay Meetings, dates discussed:    Additional Comments:  Golda AcreDavis, Rhonda Lynn, RN 04/16/2018, 10:26 AM

## 2018-04-16 NOTE — Progress Notes (Signed)
Inpatient Diabetes Program Recommendations  AACE/ADA: New Consensus Statement on Inpatient Glycemic Control (2015)  Target Ranges:  Prepandial:   less than 140 mg/dL      Peak postprandial:   less than 180 mg/dL (1-2 hours)      Critically ill patients:  140 - 180 mg/dL   Lab Results  Component Value Date   GLUCAP 257 (H) 04/16/2018   HGBA1C 9.9 (H) 04/12/2018    Review of Glycemic Control  Instructed pt on insulin pen administration. Pt states he "might" decide to take the insulin. Pt was able to return demonstration. Instructed to check blood sugars 3-4x/day and f/u with PCP. Pt was very anxious to leave. Again, reviewed hypoglycemia s/s and treatment.  Thank you. Ailene Ardshonda Juancarlos Crescenzo, RD, LDN, CDE Inpatient Diabetes Coordinator [phone]

## 2018-04-16 NOTE — Discharge Summary (Signed)
Physician Discharge Summary  William Marks RDE:[id number] DOB: May 16, 1966 DOA: 04/11/2018  PCP: Patient, No Pcp Per  Admit date: 04/11/2018 Discharge date: 04/16/2018  Admitted From: Home Disposition: Home  Recommendations for Outpatient Follow-up:  1. Follow up with PCP in 1-2 weeks and follow up for further blood sugar management  2. Follow up with Urology for Microscopic Hematuria 3. Follow up with Pulmonary in 1-2 weeks 4. Please obtain CMP/CBC, Mag, Phos in one week 5. Please follow up on the following pending results:  Home Health: No Equipment/Devices: None  Discharge Condition: Stable  CODE STATUS: FULL CODE Diet recommendation: Heart Healthy/Carb Modified Diet  Brief/Interim Summary: William Marks a 52 y.o.malewith medical history significant ofEssential HTN and Diabetes Mellitus Type who is a Administrator and here for work whois admitted with a chief complaint of "feeling sick".   Patient states symptoms started about 2 weeks ago and about 10 days ago he started having some nausea and vomiting daily and then developed some diarrhea on and off with 2-3 loose and sometimes watery movements a day. Last bowel movement was day before admission and was nonbloody and emesis is also nonbloody. Patient also admitted to having some dizziness and states that he developed progressively worsening shortness of breath and states that even when he lays flat he is short of breath. Denies any coughing up any sputum or any urinary complaints. Denies any dysuria but did admit to having some abdominal cramps and pain on the left side of his abdomen. Patient also complains of a on and off headache with dizziness but denies any chest pain, flank pain, blurred vision or double vision. Patient states that for the last 4 days he has been trying to call over to come to the hospital but was unable to because of feeling sick and finally was able to and presented to the [facility] where he was  transferred to [facility] long hospital for admission for sepsis secondary to community-acquired lingular pneumonia. [facility] was called to admit this patient for the above complaints.  In the ED sepsis protocol was initiated and patient had a chest x-ray as well as a CT of the abdomen and pelvis which revealed patient having a lingular consolidation. Patient was placed on IV antibiotics and given 2.8 L fluid resuscitation boluses and had basic blood work done. He was transferred to [facility] for admission and is currently still being treated for his Left Sided Lingular Consolidation. Patient was not improving significantly so IV Steroids were started along with Hypertonic Saline Nebs. Today has been the first day since he has been afebrile  Patient was exhibiting odd and inappropriate behavior yesterday per nursing by sitting on the floor of his room, walking out in the hallway and stripping his clothes, along with other behaviors so Psychiatry was consulted for further evaluation and stated that no acute Psychiatric Intervention was warranted until hyponatremia is resolved.   CT of Chest Obtained to evaluate Lingular Consolidation. Discussed Case with Pulmonary NP, Salvadore Dom who recommended giving a dose of IV Lasix yesterday, continuing current Abx Regimen, and repeating CXR in AM.   Repeat chest x-ray this morning was stable and patient improved.  He was instructed on how to use his insulin and was deemed medically stable to be discharged at this time and will need to follow-up with primary care physician and endocrinologist in outpatient setting for further blood or management and follow-up with pulmonary for his pneumonia in the outpatient setting.  Discharge Diagnoses:  Principal  Problem:   Adjustment disorder, unspecified Active Problems:   Sepsis due to pneumonia ([place])   Lingular pneumonia   Dyspnea   Essential hypertension   Diabetes mellitus type 2 in obese (HCC)   AKI (acute  kidney injury) ([place])   Hyponatremia   Hepatic steatosis   Hyperbilirubinemia   Lactic acidosis   Microscopic hematuria   Tobacco abuse  Sepsis2/2toCommunityAcquiredLingularPneumonia -Admitte to inpatient telemetry -On admission patient met sepsis criteria and was febrile with temperature 103.2, a pulse rate of 117, respiratory rate of 32, lactic acid level 2.16 -Sepsis protocol started in the ED and patient was given2800 mL of Lactated Ringerboluses; Given 1 more Liter Bolus and started on IVF Maintenance that has now been discontinued.  -Lactic acid level was 2.16 on admission and then trended down to 1.53 -CheckedProcalcitonin and was 0.97 -Sepsis Physiology improving however patient continued to spike temperatures; Today is the first day he has been Afebrile  -Started empiric antibiotics and patient received IV vancomycin and IV Zosyn however will change to IV Ceftriaxone and IV Azithromycin given that this is a community-acquired pneumonia and will change to [facility] and Azithromycin at D/C -Admission Chest x-ray showed prominent left midlung field infiltrate consistent with pneumonia and CT scan of the abdomen and pelvis showed a lingular consolidation -Repeat CXR this AM is stable  -Continue with duo nebs 3 mL every 6 scheduled and albuterol every 2 as needed -Added Hypertonic Saline Nebs to regimen but patient refused -Started Mucinex 1200 g p.o. twice daily, flutter valve, and incentive spirometer -Added IV Steroids with Solumedrol 60 mg q12 as patient was not significantly improving; Will now wean to 40 mg q12h and 40 mg Daily and starte Sterapred Dose Pak as an outpatient  -Blood cultures x2 obtained in the ED and will obtain a sputum culture -Blood Cx x2 showed No Growth at 5 Days  -Sputum Gram Stain showed Rare WBC, Predominantly PMN; Rare Gram Positive Cocci, Rare Gram Positive Rods with Cx consistent with Normal Respiratory Flora  -Strep Pneumo Urinary Ag Negative and  Legionella Urine Ag Negative  -Urinalysis was not really consistent with a urinary tract infection but will still send urinary culture -Follow-up culture results and repeat CBC in the a.m. -Influenza A/B and Respiratory Virus Panel Negative so D/C'd Droplet Precautions -Antipyretics withAcetaminophen every 6 as needed and then added po Motrin 400 mg q6hprn for Fever as well  -Discontinued Telemetry as patient was not able to keep it on -Patient felt dyspneic so was started on Supplemental O2 via [place] for comfort even though he was saturating 99% on Room Air   -Obtained CT Chest w/o Contrast to evaluate PNA further and showed Lingular infiltrate increased from prior CT examination but relatively stable from the prior chest x-ray. Changes of prior granulomatous disease. Aortic Atherosclerosis noted. -I discussed the case with Pulmonary nurse practitioner Marni Griffon who recommended continuing current antibiotic regimen course for at least 10 days, giving IV Lasix 40 mg x 1, and repeating a chest x-ray in the a.m.  Per my conversation with Salvadore Dom, if patient's CXR is not stable compared to prior and worsening, pulmonology will evaluate the need for a formal consultation but it was stable so he is ok to D/C  -Home Ambulatory screen done and patient did not desaturate  Dyspnea -Likely from above however needed to rule out a cardiac etiology -We will cycle patient's cardiac troponins and check anEchocardiogram -Cardiac Troponin <0.03 x3; ECHOCardiogram showed EF of 60-65% with Grade 1  DD -Repeat CXR showed Worsening PNA and was unchanged; CT Chest done and as above -Pulmonary recommended watching the patient and now that his X-Ray is stable ok to D/C  -As above  Nausea/Vomiting/Diarrhea, improved  -? From Viral illness causing Pneumonia -Supportive Care and Antiemetics -Stopped IVF Hydration as above and placed on Heart Healthy/Carb Modified Dietbut changed it to just Carb Modified a few  days ago -Follow up with PCP   EssentialHypertension -Was Running Soft Blood Pressures but improved  -Held patient's home losartan-hydrochlorothiazide combination low blood pressures and AKI but can resume at D/C -IVF Hydration discontinued now -Resume Home Losartan 100 mg po Daily; Hold HCTZ while hospitalized -Given 1x of IV Lasix -Also Added IV Hydralazine 10 mg q6hprn for SBP>170 or DBP>100 -Continue to monitor vital signs per protocol  DiabetesMellitusType 2 -Hold patient's metformin at thousand milligrams p.o. twice daily along with glimepiride 4 mg p.o. Daily -Checked Hemoglobin A1c and was 9.9 -Placed on Moderate NovoLog sliding scale AC and at bedtime and also started Lantus 25 units sq Daily at the recommendation of the Diabetes Education Coordinator along with 4 units of Novolog TIDwm -Lantus now increased to 36 units sq Daily and increased 4 units of Novolog TIDwm to 10 units TIDwm along with increasing to Resistant Novolog SSI AC/HS -Continue to monitor CBGs;CBG's ranging from257-421;  -Blood Sugars likely elevated in IV Steroid Use and will De-escalate IV Steroids and started Prednisone taper today  -Diabetes Education Coordinator Consulted for further evaluation and recommendations  -Will need Insulin at Discharge and Diabetes Education Coordinator recommending Novolin 70/30 20 units BID but may need to increase and close PCP follow up -Commending stopping home metformin and glimepiride -Patient was given a prescription for Novolin 70/30 FlexPen 20 units twice daily, a blood glucose meter, and insulin pen tips  AcuteKidneyInjury but suspecting some form of CKD (Likely Stage 2) -Unknown baseline at this time -Likely prerenal in the setting of nausea, vomiting, diarrhea, as well as losartan hydrochlorothiazide combination -Avoid nephrotoxic medicationsand Hold Losartan-HCTZ -Continue with IV fluid hydration with normal saline at rate of 75 mL/per hour -BUN/Cr  stable now at 23/1.02 -Continue to monitor and repeat CMP in as an outpatient   Hyponatremia/Pseudohyponatremia -In the Setting ofhyperglycemia and dehydration from sepsis nausea and vomiting as well as diarrhea -Corrected sodium was 132 on admission -Now Sodium value went up from 124 -> 134 but was 133 today (Blood Sugar was 284) -Discontinued IV fluid hydration as above -Repeat CMP as an outpatient   DiffuseHepaticSteatosis/Abnormal LFTs -On CT of the abdomen and pelvis with contrast -CheckedCMP and LFTs showed AST of 37 and ALT of 34 on admission but AST is now 41 and LT is now 57 -Patient stated that he drinks alcohol occasionally and does not drink every day; Diet Education given  -Checked RightUpper quadrant ultrasound showed No acute findings. No bile duct dilatation. No evidence of cholecystitis. Fatty infiltration of the liver. -Checked Acute Hepatitis Panel and Negative -Repeat CMP as an outpatient and follow up with PCP for further workup  Hyperbilirubinemia, improved -Patient's T bili was 2.2on Admission and trended down to 0.7 but now 1.4 -Right upper quadrant ultrasound as above -Continue to monitor and trend and repeat CMP as an outpatient   LacticAcidosis, improved  -In the setting of sepsis and metformin -We will hold metformin and continue IV fluid hydration -Lactic acid level improved and went from 2.16 and is now 1.7  MicroscopicHematuria -History of nephrolithiasis per patient -On admission patient's urinalysis  showed a clear appearance large hemoglobin on his urine dipstick and 6-10 red blood cells per high-powered field -Current urine culture is pending to be sent -Repeat urinalysis yesterday did again show Moderate Hb and >500 Glucose -Hb/Hct stable  -Will need outpatient follow-up withUrology and this was recommended to him  Tobacco Abuse -Patient states he has not been smoking over a month however he has smoked since the age of 37 and  trying to quit -Smoking cessation counselingprovided  ? Right Renal Artery Aneurysm -Follow up with [facility] in 1-2 years as an outpatientwith PCP   Hypophosphatemia -Patient's Phos Level was 1.7 and improved to 4.4 -Continue to monitor and replete as necessary -Repeat phosphorus level as an outpatient   ?Bipolar/Inappropirate Behavior -He was sitting on the floor of his room multiple times -Also walking out in the hallway and stripping his clothes, along with other behaviors  -? Steroid Induced Psychosis  -Psychiatry was consulted for further evaluation and stated that no acute Psychiatric Intervention was warranted until hyponatremia is resolved however he has a normal corrected Sodium at 137 -Continue to Monitor these behaviors and if worsening will re-consult Psychiatry in AM -Behaviors have improved and he did not exhibit these behaviors again   Normocytic Anemia -Patient's Hb/Hct stable at 11.0/33.4 -Likely from suspected CKD -Check Anemia Panel as an outpatient  -Continue to Monitor for S/Sx of Bleeding -Repeat CBC as an outpatient   Discharge Instructions  Discharge Instructions    Call MD for:  difficulty breathing, headache or visual disturbances   Complete by:  As directed    Call MD for:  extreme fatigue   Complete by:  As directed    Call MD for:  hives   Complete by:  As directed    Call MD for:  persistant dizziness or light-headedness   Complete by:  As directed    Call MD for:  persistant nausea and vomiting   Complete by:  As directed    Call MD for:  redness, tenderness, or signs of infection (pain, swelling, redness, odor or green/yellow discharge around incision site)   Complete by:  As directed    Call MD for:  severe uncontrolled pain   Complete by:  As directed    Call MD for:  temperature >100.4   Complete by:  As directed    Diet - low sodium heart healthy   Complete by:  As directed    Discharge instructions   Complete by:  As directed     Follow-up with PCP and have PCP refer to pulmonology to establish care.  Take all medications as prescribed and recommend close follow-up of diabetes with primary care physician as patient has been started on insulin.  If symptoms change or worsen please return the emergency room for evaluation.   Increase activity slowly   Complete by:  As directed      Allergies as of 04/16/2018   No Known Allergies     Medication List    STOP taking these medications   glimepiride 4 MG tablet Commonly known as:  AMARYL   metFORMIN 1000 MG tablet Commonly known as:  GLUCOPHAGE   TYLENOL COLD/FLU SEVERE 5-10-200-325 MG Tabs Generic drug:  Phenylephrine-DM-GG-APAP     TAKE these medications   azithromycin 250 MG tablet Commonly known as:  ZITHROMAX Take 500 mg po Daily for 5 more days Start taking on:  04/17/2018   blood glucose meter kit and supplies Kit Dispense based on patient and insurance preference.  Use up to four times daily as directed. (FOR ICD-9 250.00, 250.01).   cefdinir 300 MG capsule Commonly known as:  OMNICEF Take 1 capsule (300 mg total) by mouth every 12 (twelve) hours.   guaiFENesin 600 MG 12 hr tablet Commonly known as:  MUCINEX Take 2 tablets (1,200 mg total) by mouth 2 (two) times daily.   Insulin Isophane & Regular Human (70-30) 100 UNIT/ML PEN Commonly known as:  NOVOLIN 70/30 FLEXPEN Inject 20 Units into the skin 2 (two) times daily with a meal.   losartan-hydrochlorothiazide 100-12.5 MG tablet Commonly known as:  HYZAAR Take 1 tablet by mouth daily.   Pen Needles 31G X 5 MM Misc 1 Container by Does not apply route 2 (two) times daily.   predniSONE 10 MG (21) Tbpk tablet Commonly known as:  STERAPRED UNI-PAK 21 TAB Take 6 pills day 1, 5 pills day 2, 4 pills day 3, 3 pills day 4, 2 pills day 5, 1 pill day 6 and stop on day 7       No Known Allergies  Consultations:  Discussed Case with Pulmonary NP, Salvadore Dom  Psychiatry  Diabetes Education  Coordinator   Procedures/Studies: Dg Chest 2 View  Result Date: 04/12/2018 CLINICAL DATA:  Worsening cough, sob, mid chest, mid abd pain this a.m, EXAM: CHEST - 2 VIEW COMPARISON:  Chest x-ray dated 04/11/2018. FINDINGS: Increased size of the LEFT lower lung pneumonia. RIGHT lung remains clear. Heart size and mediastinal contours are stable. IMPRESSION: Increased size of the LEFT lower lung pneumonia, now large. Electronically Signed   By: Franki Cabot M.D.   On: 04/12/2018 10:38   Dg Chest 2 View  Result Date: 04/11/2018 CLINICAL DATA:  Fevers, body aches, fatigue. EXAM: CHEST - 2 VIEW COMPARISON:  No prior. FINDINGS: Mediastinum and hilar structures normal. Prominent left mid lung field infiltrate noted consistent pneumonia. No pleural effusion or pneumothorax. Heart size normal. No acute bony abnormality. IMPRESSION: Prominent left mid lung field infiltrate consistent with pneumonia. Followup PA and lateral chest X-ray is recommended in 3-4 weeks following trial of antibiotic therapy to ensure resolution and exclude underlying malignancy. Electronically Signed   By: Marcello Moores  Register   On: 04/11/2018 11:41   Ct Chest Wo Contrast  Result Date: 04/15/2018 CLINICAL DATA:  Follow-up lingular infiltrate EXAM: CT CHEST WITHOUT CONTRAST TECHNIQUE: Multidetector CT imaging of the chest was performed following the standard protocol without IV contrast. COMPARISON:  04/11/2018 CT of the abdomen and pelvis, 04/14/2018 chest x-ray FINDINGS: Cardiovascular: Somewhat limited due to the lack of IV contrast. Atherosclerotic calcifications are noted. The aorta is not significantly dilated no cardiac enlargement is seen. Mild coronary calcifications are noted. Mediastinum/Nodes: Thoracic inlet is within normal limits. Multiple calcified hilar and mediastinal lymph nodes are noted consistent with prior granulomatous disease. No sizable adenopathy is noted at this time. The esophagus is within normal limits.  Lungs/Pleura: The right lung is well aerated without focal infiltrate or sizable effusion. No parenchymal nodules are seen. The left lung demonstrates diffuse lingular and upper lobe infiltrate with peribronchial thickening consistent with acute pneumonia. This appears increased when compared with the prior CT of the abdomen and pelvis but relatively stable from recent chest x-rays. No sizable left effusion is noted. Upper Abdomen: Visualized upper abdomen is unremarkable. Musculoskeletal: Degenerative changes of the thoracic spine are seen. IMPRESSION: Lingular infiltrate increased from prior CT examination but relatively stable from the prior chest x-ray. Continued follow-up is recommended. Changes of prior granulomatous disease. Aortic Atherosclerosis (  ICD10-I70.0). Electronically Signed   By: Inez Catalina M.D.   On: 04/15/2018 08:34   Ct Abdomen Pelvis W Contrast  Result Date: 04/11/2018 CLINICAL DATA:  Two week history of fevers, fatigue, and body aches. EXAM: CT ABDOMEN AND PELVIS WITH CONTRAST TECHNIQUE: Multidetector CT imaging of the abdomen and pelvis was performed using the standard protocol following bolus administration of intravenous contrast. CONTRAST:  18m ISOVUE-300 IOPAMIDOL (ISOVUE-300) INJECTION 61% COMPARISON:  None. FINDINGS: Lower chest: Partially visualized patchy consolidation in the lingula. Hepatobiliary: Diffuse hepatic steatosis. No focal liver abnormality. The gallbladder is decompressed. No biliary dilatation. Pancreas: Unremarkable. No pancreatic ductal dilatation or surrounding inflammatory changes. Spleen: Normal in size without focal abnormality. Adrenals/Urinary Tract: The adrenal glands are unremarkable. Small bilateral renal simple cyst. Subcentimeter low-density lesion in the upper pole of the left kidney is too small to characterize. No renal or ureteral calculi. No hydronephrosis. The bladder is unremarkable. Stomach/Bowel: Stomach is within normal limits. Appendix  appears normal. No evidence of bowel wall thickening, distention, or inflammatory changes. Vascular/Lymphatic: Mild aortic atherosclerosis. 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 No enlarged abdominal or pelvic lymph nodes. Reproductive: Prostate is unremarkable. Other: Small fat containing right inguinal hernia. No free fluid or pneumoperitoneum. Musculoskeletal: No acute or significant osseous findings. Moderate degenerative disc disease at L5-S1. IMPRESSION: 1. Lingular pneumonia. 2.  No acute intra-abdominal process. 3. Diffuse hepatic steatosis. 4. 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 Follow-up CT angiogram of the abdomen in 1-2 years is recommended. This recommendation follows ACR consensus guidelines: White Paper of the ACR Incidental Findings Committee II on Vascular Findings. J Am Coll Radiol [id number] Electronically Signed   By: WTitus DubinM.D.   On: 04/11/2018 11:50   Dg Chest Port 1 View  Result Date: 04/16/2018 CLINICAL DATA:  Shortness of breath EXAM: PORTABLE CHEST 1 VIEW COMPARISON:  CT from the previous day FINDINGS: Cardiac shadow is stable. Right lung remains clear. The overall inspiratory effort is poor. Persistent lingular infiltrate is again noted. Bony structures are within normal limits. IMPRESSION: Stable lingular infiltrate. Electronically Signed   By: MInez CatalinaM.D.   On: 04/16/2018 07:53   Dg Chest Port 1 View  Result Date: 04/14/2018 CLINICAL DATA:  Short of breath.  Follow-up exam. EXAM: PORTABLE CHEST 1 VIEW COMPARISON:  04/13/2018 and older exams. FINDINGS: Left mid to lower lung zone consolidation is without change from the prior study. There are no new areas of lung consolidation. Right lung remains clear. Heart is normal size.  No mediastinal or hilar masses. No convincing pleural effusion.  No pneumothorax. IMPRESSION: 1. No  change from the previous day's study. 2. Persistent left mid to lower lung consolidation consistent with pneumonia. Electronically Signed   By: DLajean ManesM.D.   On: 04/14/2018 07:32   Dg Chest Port 1 View  Result Date: 04/13/2018 CLINICAL DATA:  Shortness of breath. EXAM: PORTABLE CHEST 1 VIEW COMPARISON:  04/12/2018 FINDINGS: The cardiomediastinal silhouette is unchanged. Extensive airspace opacity throughout the left mid and lower lung has not significantly changed. The right lung remains clear. No large pleural effusion or pneumothorax is identified. IMPRESSION: Unchanged left lung pneumonia. Electronically Signed   By: ALogan BoresM.D.   On: 04/13/2018 07:19   UKoreaAbdomen Limited Ruq  Result Date: 04/12/2018 CLINICAL DATA:  Hyperbilirubinemia EXAM: ULTRASOUND ABDOMEN LIMITED RIGHT UPPER QUADRANT COMPARISON:  None. FINDINGS: Gallbladder:  Contracted. No gallstones or wall thickening visualized. No sonographic Murphy sign noted by sonographer. Common bile duct: Diameter: 5 mm Liver: No focal lesion identified. Liver is diffusely echogenic indicating fatty infiltration. Portal vein is patent on color Doppler imaging with normal direction of blood flow towards the liver. IMPRESSION: 1. No acute findings. No bile duct dilatation. No evidence of cholecystitis. 2. Fatty infiltration of the liver. Electronically Signed   By: Franki Cabot M.D.   On: 04/12/2018 11:13    ECHOCARDIOGRAM ------------------------------------------------------------------- Study Conclusions  - Left ventricle: The cavity size was normal. Wall thickness was   increased in a pattern of mild LVH. Systolic function was normal.   The estimated ejection fraction was in the range of 60% to 65%.   Wall motion was normal; there were no regional wall motion   abnormalities. Doppler parameters are consistent with abnormal   left ventricular relaxation (grade 1 diastolic dysfunction). - Left atrium: The atrium was mildly  dilated.  Subjective: Seen and examined at bedside stated he was feeling better.  No chest pain, shortness breath, nausea, vomiting.  Is ready to go home and was anxious to leave.  No other concerns or quads at this time  Discharge Exam: Vitals:   04/15/18 1948 04/16/18 0538  BP: (!) 143/88 (!) 132/91  Pulse: 73 63  Resp: 18 20  Temp: 98.4 F (36.9 C) 97.7 F (36.5 C)  SpO2: 97% 97%   Vitals:   04/15/18 1116 04/15/18 1609 04/15/18 1948 04/16/18 0538  BP: (!) 151/78 (!) 161/90 (!) 143/88 (!) 132/91  Pulse: 72 66 73 63  Resp: 18 20 18 20   Temp: 98 F (36.7 C) 97.8 F (36.6 C) 98.4 F (36.9 C) 97.7 F (36.5 C)  TempSrc: Oral Oral    SpO2: 97% 97% 97% 97%  Weight:      Height:       General: Pt is alert, awake, not in acute distress Cardiovascular: RRR, S1/S2 +, no rubs, no gallops Respiratory: Diminished bilaterally, no wheezing, no rhonchi Abdominal: Soft, NT, Distended due to body habitus, bowel sounds + Extremities: no edema, no cyanosis  The results of significant diagnostics from this hospitalization (including imaging, microbiology, ancillary and laboratory) are listed below for reference.    Microbiology: Recent Results (from the past 240 hour(s))  Blood Culture (routine x 2)     Status: None   Collection Time: 04/11/18  9:47 AM  Result Value Ref Range Status   Specimen Description   Final    BLOOD LEFT FOREARM Performed at [facility], [address] [place], [place] [address]    Special Requests   Final    BOTTLES DRAWN AEROBIC AND ANAEROBIC Blood Culture adequate volume   Culture   Final    NO GROWTH 5 DAYS Performed at [facility], [place] [address], [place], [place] [address]    Report Status 04/16/2018 FINAL  Final  Rapid Strep Screen ([facility] ONLY)     Status: None   Collection Time: 04/11/18  9:56 AM  Result Value Ref Range Status   Streptococcus, Group A Screen (Direct) NEGATIVE NEGATIVE Final    Comment: (NOTE) A Rapid  Antigen test may result negative if the antigen level in the sample is below the detection level of this test. The FDA has not cleared this test as a stand-alone test therefore the rapid antigen negative result has reflexed to a Group A Strep culture. Performed at [facility],  [place], [place] [address]   Culture, group A strep     Status: None   Collection Time: 04/11/18  9:56 AM  Result Value Ref Range Status   Specimen Description   Final    THROAT Performed at [facility], [address] [place], [place] [address]    Special Requests   Final    NONE Reflexed from [id number] Performed at [facility], [address] [place], [place] [address]    Culture   Final    NO GROUP A STREP (S.PYOGENES) ISOLATED Performed at [facility], [place] [address], [place], [place] [address]    Report Status 04/13/2018 FINAL  Final  Blood Culture (routine x 2)     Status: None   Collection Time: 04/11/18 10:00 AM  Result Value Ref Range Status   Specimen Description   Final    BLOOD RIGHT ARM Performed at [facility], [address] [place], [place] [address]    Special Requests   Final    BOTTLES DRAWN AEROBIC AND ANAEROBIC Blood Culture adequate volume Performed at [facility], [address], [place], [place] [address]    Culture   Final    NO GROWTH 5 DAYS Performed at [facility], [place] [address], [place], [place] [address]    Report Status 04/16/2018 FINAL  Final  Respiratory Panel by PCR     Status: None   Collection Time: 04/11/18  5:29 PM  Result Value Ref Range Status   Adenovirus NOT DETECTED NOT DETECTED Final   Coronavirus 229E NOT DETECTED NOT DETECTED Final   Coronavirus HKU1 NOT DETECTED NOT DETECTED Final   Coronavirus NL63 NOT DETECTED NOT DETECTED Final   Coronavirus OC43 NOT DETECTED NOT DETECTED Final   Metapneumovirus NOT DETECTED NOT DETECTED Final   Rhinovirus / Enterovirus NOT  DETECTED NOT DETECTED Final   Influenza A NOT DETECTED NOT DETECTED Final   Influenza B NOT DETECTED NOT DETECTED Final   Parainfluenza Virus 1 NOT DETECTED NOT DETECTED Final   Parainfluenza Virus 2 NOT DETECTED NOT DETECTED Final   Parainfluenza Virus 3 NOT DETECTED NOT DETECTED Final   Parainfluenza Virus 4 NOT DETECTED NOT DETECTED Final   Respiratory Syncytial Virus NOT DETECTED NOT DETECTED Final   Bordetella pertussis NOT DETECTED NOT DETECTED Final   Chlamydophila pneumoniae NOT DETECTED NOT DETECTED Final   Mycoplasma pneumoniae NOT DETECTED NOT DETECTED Final    Comment: Performed at [facility], [place] [address] [place], [place] [address]  Culture, sputum-assessment     Status: None   Collection Time: 04/11/18  7:54 PM  Result Value Ref Range Status   Specimen Description EXPECTORATED SPUTUM  Final   Special Requests NONE  Final   Sputum evaluation   Final    THIS SPECIMEN IS ACCEPTABLE FOR SPUTUM CULTURE Performed at [facility], [place] [address], [place], [place] [address]    Report Status 04/11/2018 FINAL  Final  Culture, respiratory (NON-Expectorated)     Status: None   Collection Time: 04/11/18  7:54 PM  Result Value Ref Range Status   Specimen Description   Final    EXPECTORATED SPUTUM Performed at [facility] [address], [place], [place] [address]    Special Requests   Final    NONE Reflexed from [id number] Performed at [facility], [place] [address] [place],  [place] [address]    Gram Stain   Final    RARE WBC PRESENT, PREDOMINANTLY PMN RARE GRAM POSITIVE COCCI RARE GRAM POSITIVE RODS    Culture   Final    Consistent with normal respiratory flora. Performed at [facility], [place] [address] [place], [place] [address]    Report Status 04/14/2018 FINAL  Final    Labs: BNP (last 3 results) Recent Labs    04/11/18 1717  BNP 36.4   Basic Metabolic Panel: Recent Labs  Lab  04/12/18 0541 04/13/18 0540 04/13/18 1354 04/14/18 0511 04/14/18 0540 04/15/18 0522 04/15/18 1642 04/16/18 0552  NA 132* 134* 132*  --  130* 132*  --  133*  K 4.1 3.8 4.1  --  4.5 4.6  --  4.7  CL 98* 99* 100*  --  99* 102  --  101  CO2 26 26 24   --  21* 23  --  24  GLUCOSE 309* 267* 351*  --  390* 395* 422* 284*  BUN 15 15 17   --  22* 25*  --  23*  CREATININE 1.11 1.00 0.99  --  0.99 1.01  --  1.02  CALCIUM 8.3* 8.3* 8.5*  --  8.5* 8.6*  --  8.5*  MG 1.7 1.8  --  1.9  --  2.0  --  2.0  PHOS 1.7* 2.6  --  3.9  --  3.9  --  4.4   Liver Function Tests: Recent Labs  Lab 04/13/18 0540 04/13/18 1354 04/14/18 0540 04/15/18 0522 04/16/18 0552  AST 96* 114* 67* 41 51*  ALT 82* 103* 96* 82* 61*  ALKPHOS 51 62 65 77 66  BILITOT 0.9 1.4* 0.9 0.7 1.4*  PROT 6.6 6.7 6.6 6.5 6.1*  ALBUMIN 2.9* 2.8* 2.7* 2.8* 2.7*   No results for input(s): LIPASE, AMYLASE in the last 168 hours. No results for input(s): AMMONIA in the last 168 hours. CBC: Recent Labs  Lab 04/12/18 0541 04/13/18 0540 04/14/18 0511 04/15/18 0522 04/16/18 0552  WBC 7.3 4.2 4.3 7.1 6.7  NEUTROABS 5.3 2.7 3.5 5.4 3.4  HGB 11.4* 11.1* 10.7* 11.1* 11.0*  HCT 33.4* 33.0* 31.6* 32.5* 33.4*  MCV 88.6 88.9 87.3 88.8 89.3  PLT 207 210 255 312 405*   Cardiac Enzymes: Recent Labs  Lab 04/11/18 1717 04/11/18 2250 04/12/18 0541  TROPONINI <0.03 <0.03 <0.03   BNP: Invalid input(s): POCBNP CBG: Recent Labs  Lab 04/15/18 1106 04/15/18 1630 04/15/18 2056 04/16/18 0725 04/16/18 1114  GLUCAP 338* 404* 421* 246* 257*   D-Dimer No results for input(s): DDIMER in the last 72 hours. Hgb A1c No results for input(s): HGBA1C in the last 72 hours. Lipid Profile No results for input(s): CHOL, HDL, LDLCALC, TRIG, CHOLHDL, LDLDIRECT in the last 72 hours. Thyroid function studies No results for input(s): TSH, T4TOTAL, T3FREE, THYROIDAB in the last 72 hours.  Invalid input(s): FREET3 Anemia work up No results for  input(s): VITAMINB12, FOLATE, FERRITIN, TIBC, IRON, RETICCTPCT in the last 72 hours. Urinalysis    Component Value Date/Time   COLORURINE YELLOW 04/13/2018 1415   APPEARANCEUR CLEAR 04/13/2018 1415   LABSPEC 1.025 04/13/2018 1415   PHURINE 6.0 04/13/2018 1415   GLUCOSEU >=500 (A) 04/13/2018 1415   HGBUR MODERATE (A) 04/13/2018 1415   BILIRUBINUR NEGATIVE 04/13/2018 1415   KETONESUR 5 (A) 04/13/2018 1415   PROTEINUR NEGATIVE 04/13/2018 1415   NITRITE NEGATIVE 04/13/2018 1415   LEUKOCYTESUR NEGATIVE 04/13/2018 1415   Sepsis Labs Invalid input(s):  PROCALCITONIN,  WBC,  LACTICIDVEN Microbiology Recent Results (from the past 240 hour(s))  Blood Culture (routine x 2)     Status: None   Collection Time: 04/11/18  9:47 AM  Result Value Ref Range Status   Specimen Description   Final    BLOOD LEFT FOREARM Performed at [facility], [address] [place], [place] [address]    Special Requests   Final    BOTTLES DRAWN AEROBIC AND ANAEROBIC Blood Culture adequate volume   Culture   Final    NO GROWTH 5 DAYS Performed at [facility], [place] [address], [place], [place] [address]    Report Status 04/16/2018 FINAL  Final  Rapid Strep Screen ([facility] ONLY)     Status: None   Collection Time: 04/11/18  9:56 AM  Result Value Ref Range Status   Streptococcus, Group A Screen (Direct) NEGATIVE NEGATIVE Final    Comment: (NOTE) A Rapid Antigen test may result negative if the antigen level in the sample is below the detection level of this test. The FDA has not cleared this test as a stand-alone test therefore the rapid antigen negative result has reflexed to a Group A Strep culture. Performed at [facility], [address] [place], [place] [address]   Culture, group A strep     Status: None   Collection Time: 04/11/18  9:56 AM  Result Value Ref Range Status   Specimen Description   Final    THROAT Performed at [facility], [address]  [place], [place] [address]    Special Requests   Final    NONE Reflexed from [id number] Performed at [facility], [address] [place], [place] [address]    Culture   Final    NO GROUP A STREP (S.PYOGENES) ISOLATED Performed at [facility], [place] [address] [place], [place] [address]    Report Status 04/13/2018 FINAL  Final  Blood Culture (routine x 2)     Status: None   Collection Time: 04/11/18 10:00 AM  Result Value Ref Range Status   Specimen Description   Final    BLOOD RIGHT ARM Performed at [facility], [address] [place], [place] [address]    Special Requests   Final    BOTTLES DRAWN AEROBIC AND ANAEROBIC Blood Culture adequate volume Performed at [facility], [address], [place], [place] [address]    Culture   Final    NO GROWTH 5 DAYS Performed at [facility], [place] [address], [place], [place] [address]    Report Status 04/16/2018 FINAL  Final  Respiratory Panel by PCR     Status: None   Collection Time: 04/11/18  5:29 PM  Result Value Ref Range Status   Adenovirus NOT DETECTED NOT DETECTED Final   Coronavirus 229E NOT DETECTED NOT DETECTED Final   Coronavirus HKU1 NOT DETECTED NOT DETECTED Final   Coronavirus NL63 NOT DETECTED NOT DETECTED Final   Coronavirus OC43 NOT DETECTED NOT DETECTED Final   Metapneumovirus NOT DETECTED NOT DETECTED Final   Rhinovirus / Enterovirus NOT DETECTED NOT DETECTED Final   Influenza A NOT DETECTED NOT DETECTED Final   Influenza B NOT DETECTED NOT DETECTED Final   Parainfluenza Virus 1 NOT DETECTED NOT DETECTED Final   Parainfluenza Virus 2 NOT DETECTED NOT DETECTED Final   Parainfluenza Virus 3 NOT DETECTED NOT DETECTED  Final   Parainfluenza Virus 4 NOT DETECTED NOT DETECTED Final   Respiratory Syncytial Virus NOT DETECTED NOT DETECTED Final   Bordetella pertussis NOT DETECTED NOT DETECTED Final   Chlamydophila pneumoniae NOT DETECTED NOT DETECTED Final   Mycoplasma pneumoniae  NOT DETECTED NOT DETECTED Final    Comment: Performed at [facility], [place] [address], [place], [place] [address]  Culture, sputum-assessment     Status: None   Collection Time: 04/11/18  7:54 PM  Result Value Ref Range Status   Specimen Description EXPECTORATED SPUTUM  Final   Special Requests NONE  Final   Sputum evaluation   Final    THIS SPECIMEN IS ACCEPTABLE FOR SPUTUM CULTURE Performed at [facility], [place] [address] [place], [place] [address]    Report Status 04/11/2018 FINAL  Final  Culture, respiratory (NON-Expectorated)     Status: None   Collection Time: 04/11/18  7:54 PM  Result Value Ref Range Status   Specimen Description   Final    EXPECTORATED SPUTUM Performed at [place] [address] [place], [place] [address]    Special Requests   Final    NONE Reflexed from [id number] Performed at [facility], [place] [address] [place], [place] [address]    Gram Stain   Final    RARE WBC PRESENT, PREDOMINANTLY PMN RARE GRAM POSITIVE COCCI RARE GRAM POSITIVE RODS    Culture   Final    Consistent with normal respiratory flora. Performed at [facility], [place] [address], [place], [place] [address]    Report Status 04/14/2018 FINAL  Final   Time coordinating discharge: 35 minutes  SIGNED:  Kerney Elbe, DO [facility] 04/16/2018, 6:06 PM Pager [phone]  If 7PM-7AM, please contact night-coverage [email or web address] Password [email or web address]

## 2019-12-17 IMAGING — DX DG CHEST 1V PORT
2 series · 2 of 2 positions shown · non-contrast
Comparison: 04/12/2018

CLINICAL DATA: Shortness of breath.

EXAM:
PORTABLE CHEST 1 VIEW

[chest ap (1 of 2)]
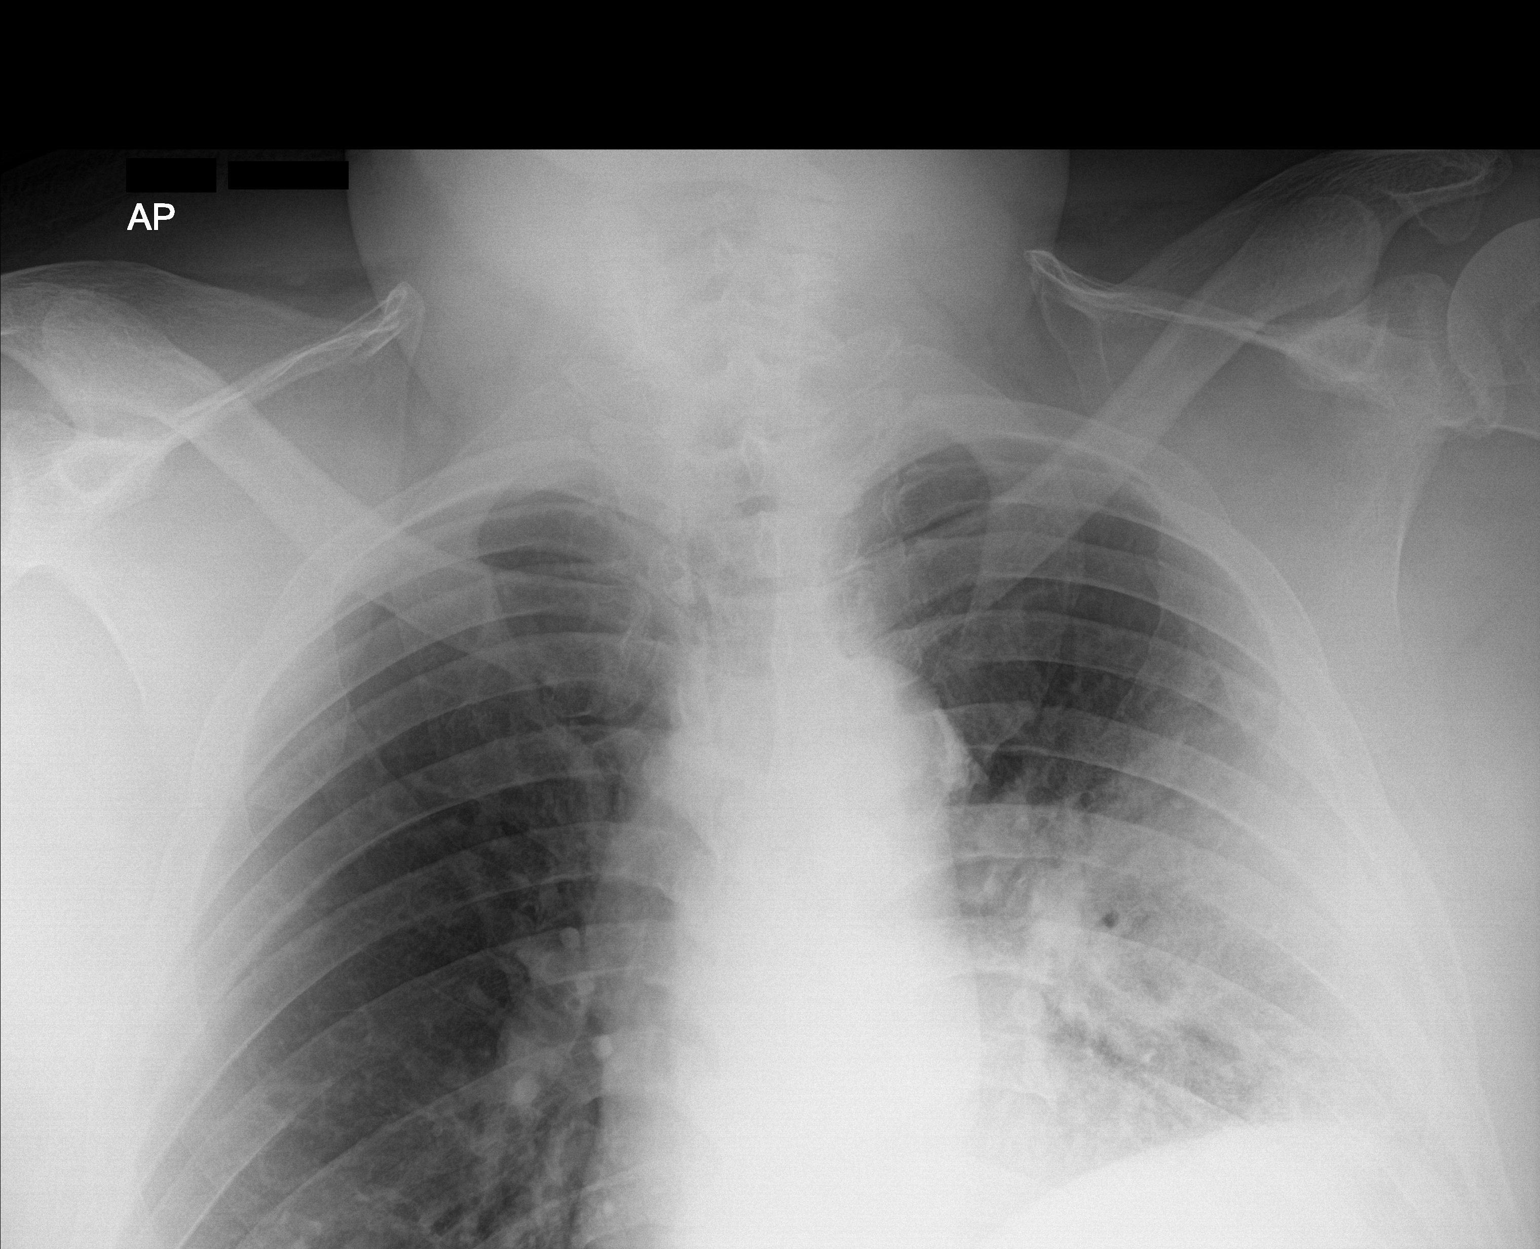

[chest ap (2 of 2)]
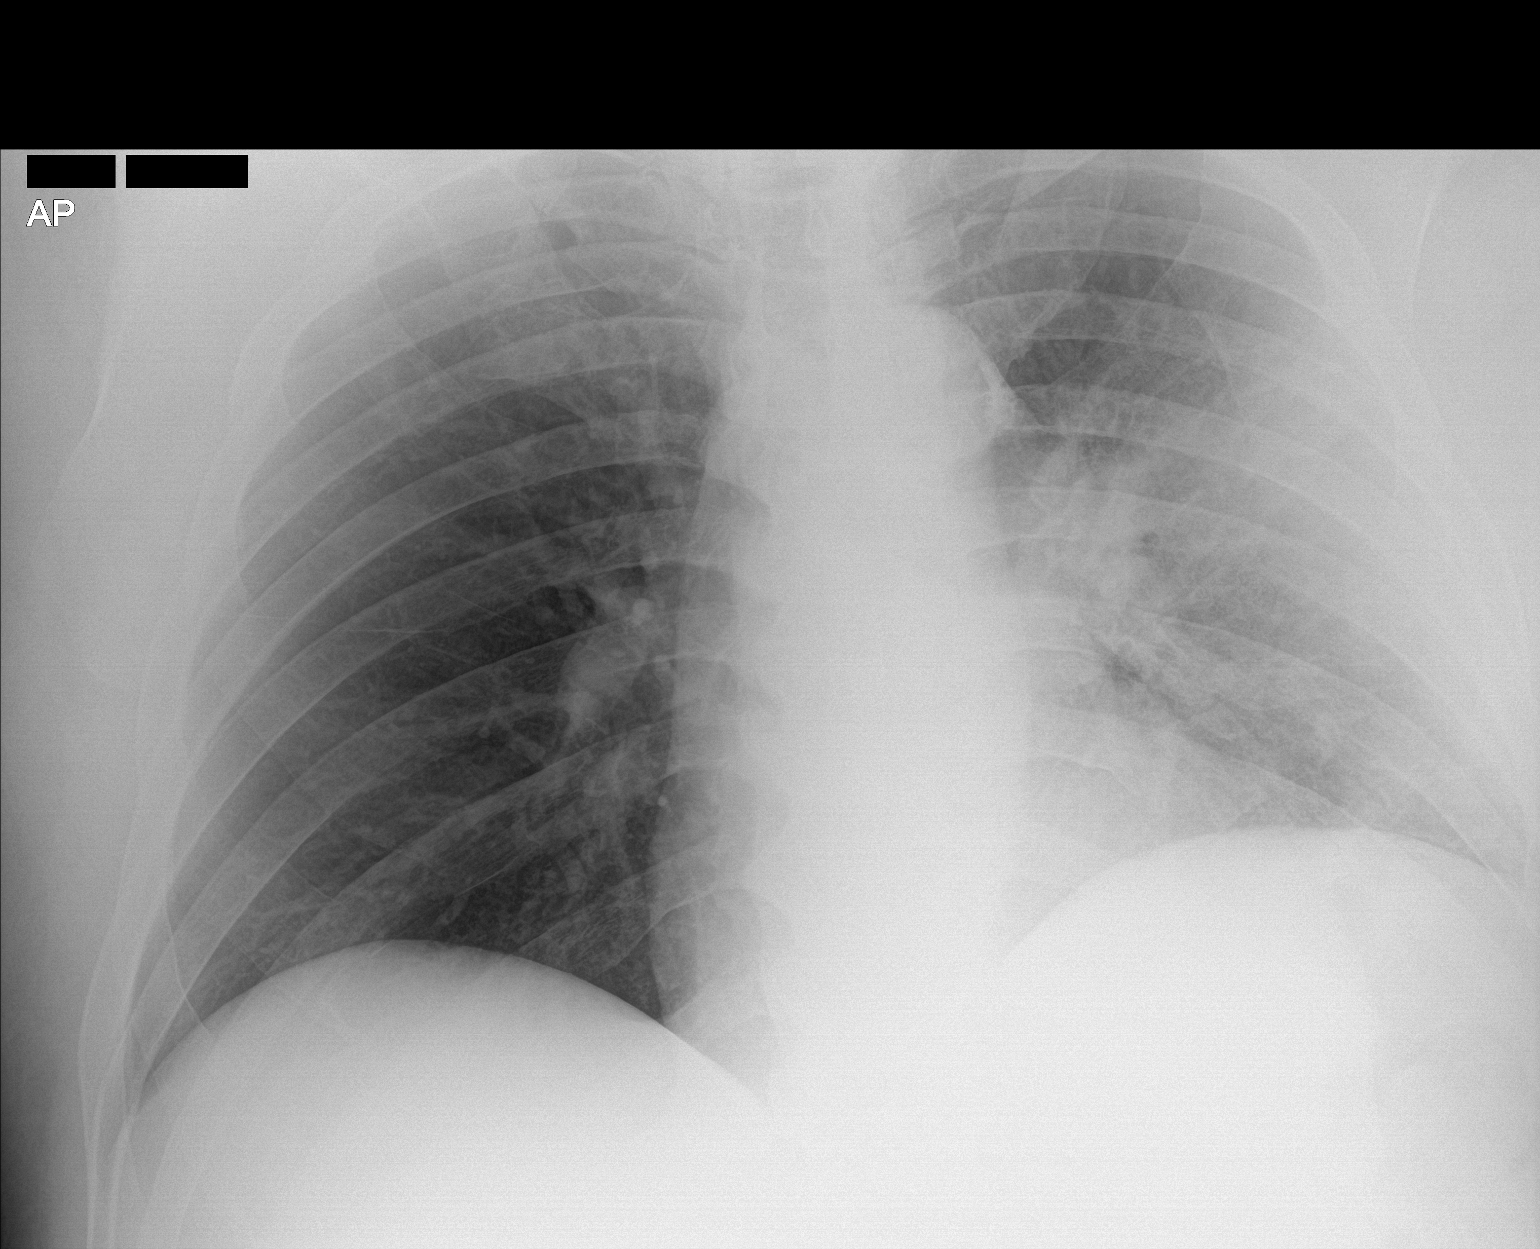

[2 of 2 positions shown; findings below may reference images not displayed]

FINDINGS: The cardiomediastinal silhouette is unchanged. Extensive airspace
opacity throughout the left mid and lower lung has not significantly
changed. The right lung remains clear. No large pleural effusion or
pneumothorax is identified.
IMPRESSION: Unchanged left lung pneumonia.

## 2019-12-19 IMAGING — CT CT CHEST W/O CM
2 of 4 series · 15 of 36 positions shown, 18 images · non-contrast
Comparison: 04/11/2018 CT of the abdomen and pelvis, 04/14/2018
chest x-ray

CLINICAL DATA: Follow-up lingular infiltrate

EXAM:
CT CHEST WITHOUT CONTRAST
TECHNIQUE: Multidetector CT imaging of the chest was performed following the
standard protocol without IV contrast.

[Series 2: thorax · axial · 0.97mm/px · z∈[+1367,+1647]mm · 12 of 166 slices shown, 15 images]
[im 13/166  mediastinal]
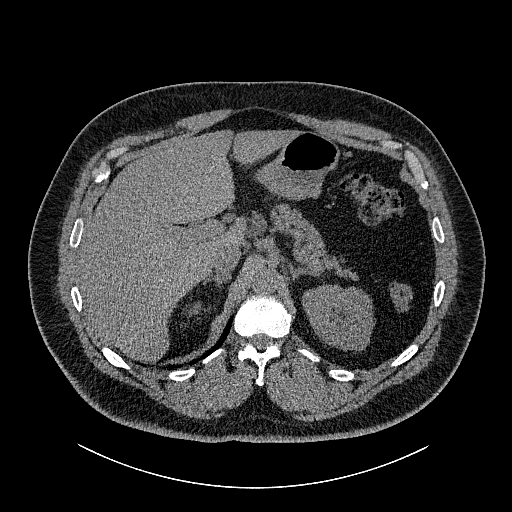
[im 13/166  lung]
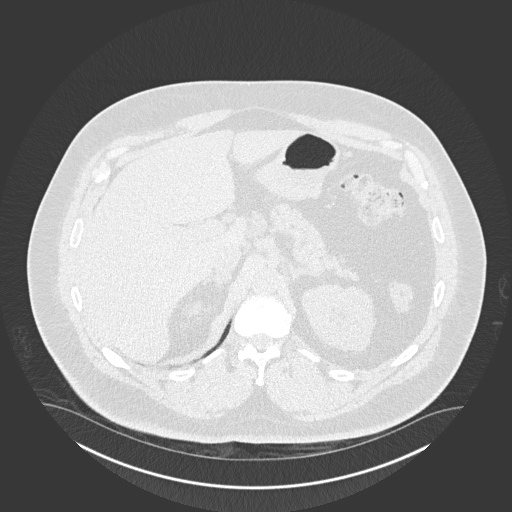
[im 26/166  lung]
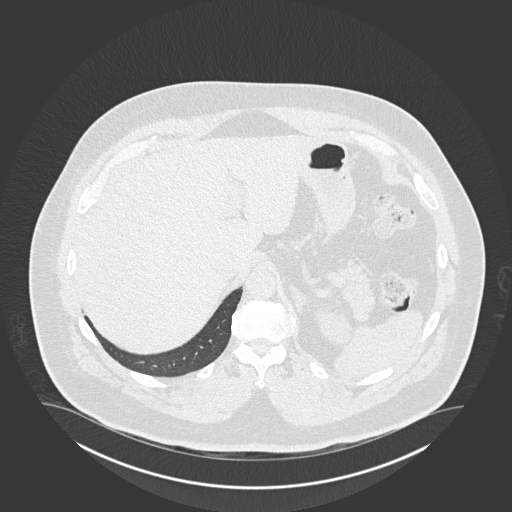
[im 39/166  lung]
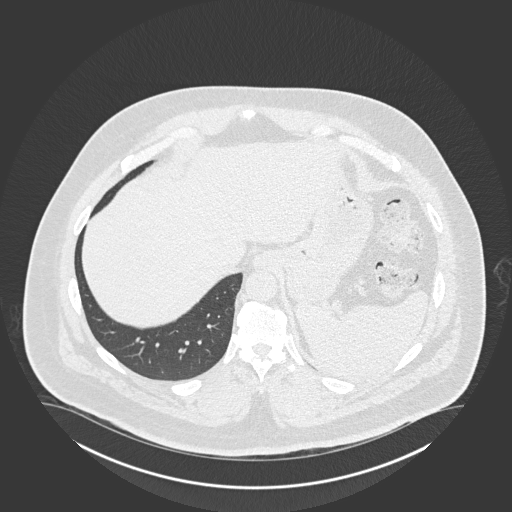
[im 51/166  lung]
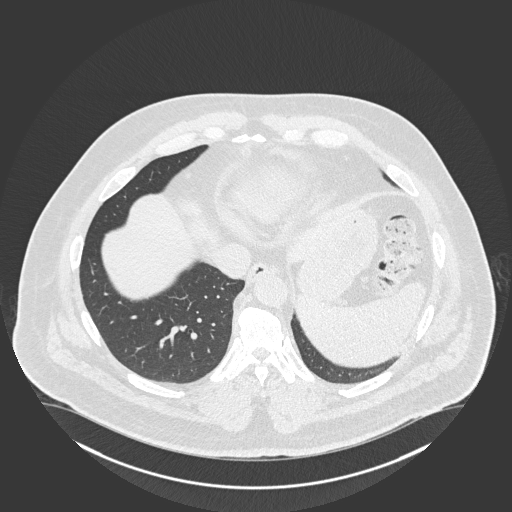
[im 64/166  mediastinal]
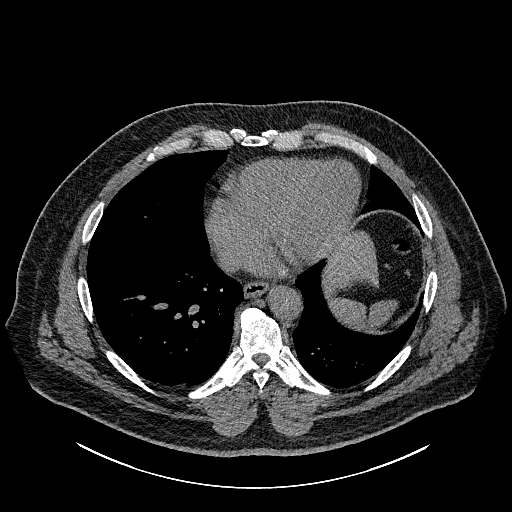
[im 64/166  lung]
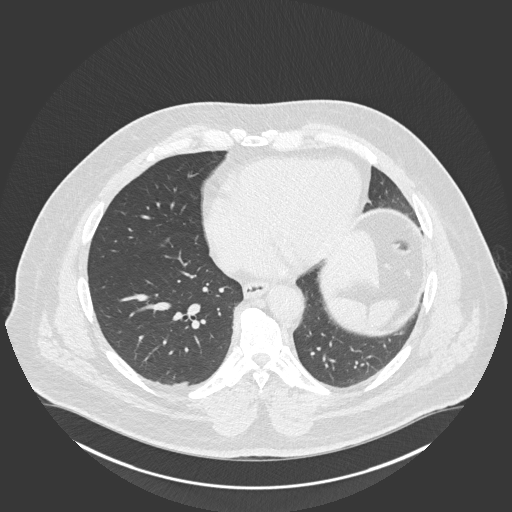
[im 77/166  lung]
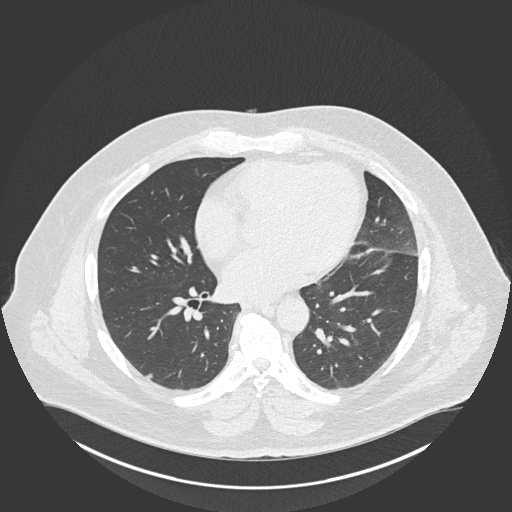
[im 89/166  lung]
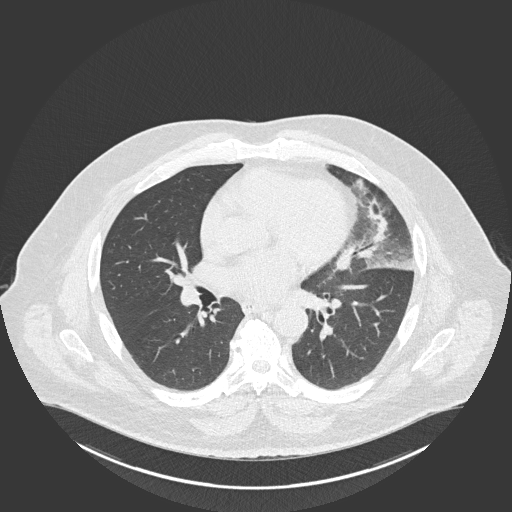
[im 102/166  lung]
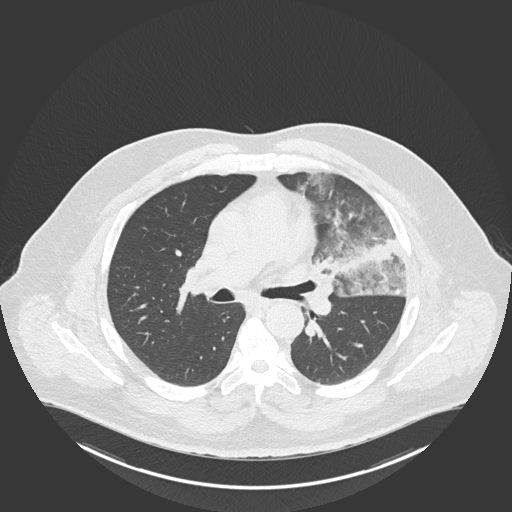
[im 115/166  mediastinal]
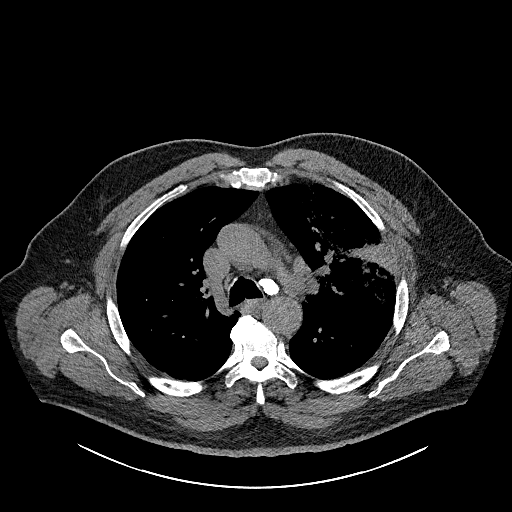
[im 115/166  lung]
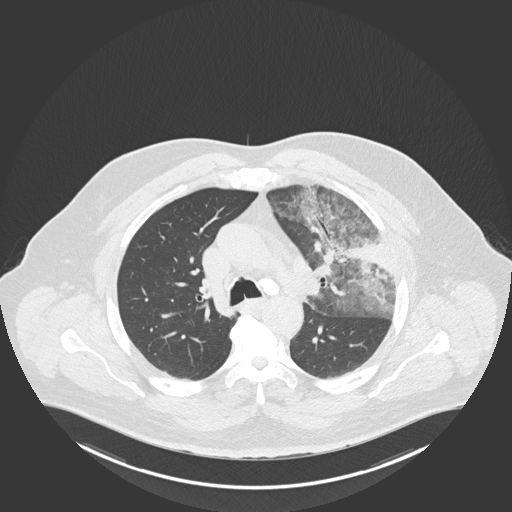
[im 127/166  lung]
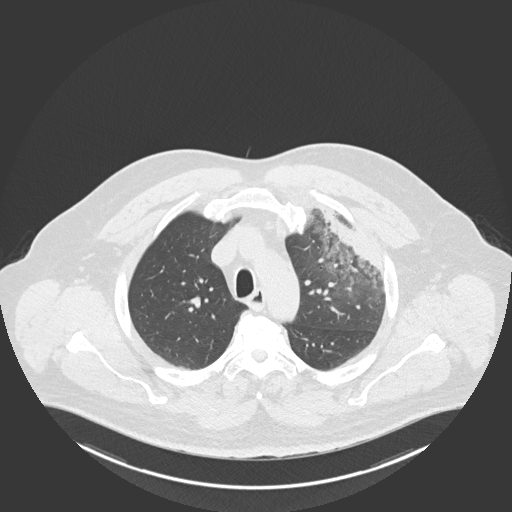
[im 140/166  lung]
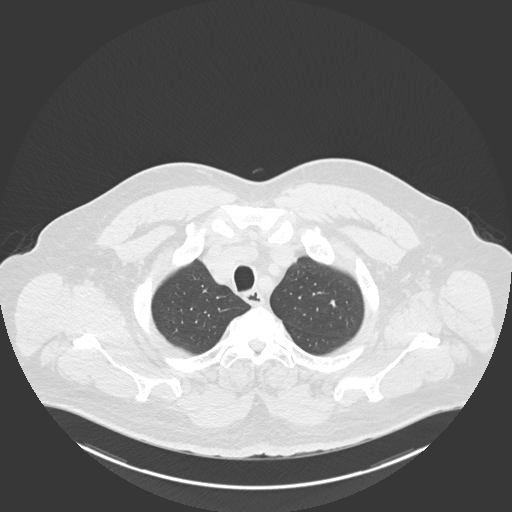
[im 153/166  lung]
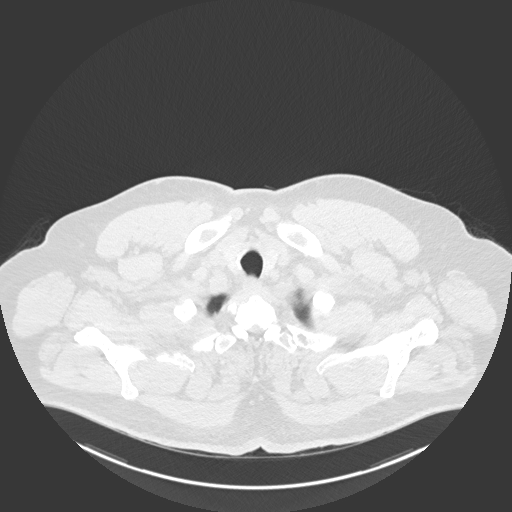

[Series 6: coronal · coronal · 0.69mm/px · 3 of 191 slices shown]
[im 39/191  lung]
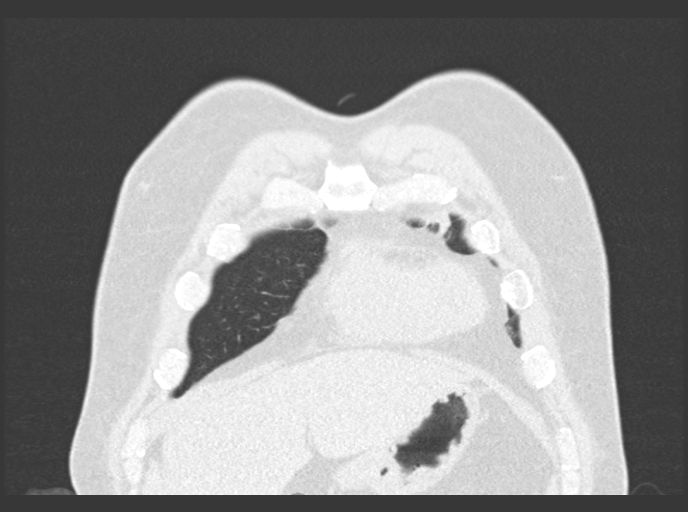
[im 77/191  lung]
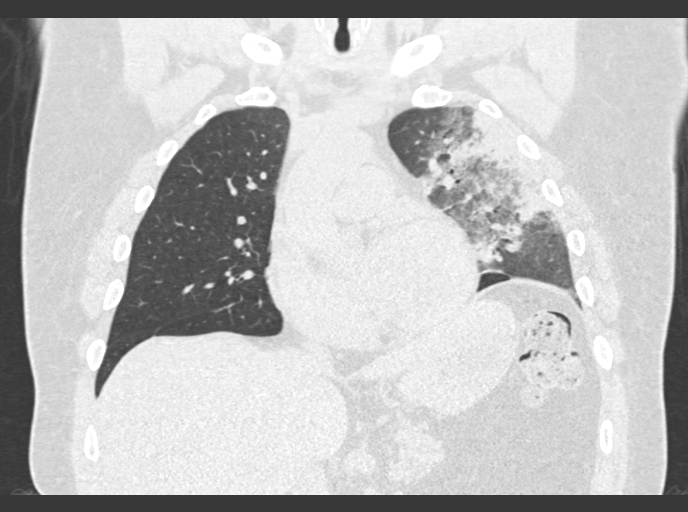
[im 115/191  lung]
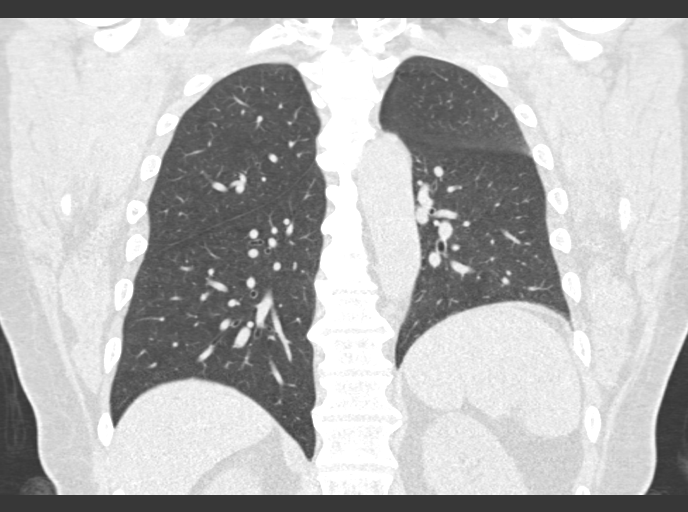

[15 of 36 positions shown; findings below may reference images not displayed]

FINDINGS: Cardiovascular: Somewhat limited due to the lack of IV contrast.
Atherosclerotic calcifications are noted. The aorta is not
significantly dilated no cardiac enlargement is seen. Mild coronary
calcifications are noted.

Mediastinum/Nodes: Thoracic inlet is within normal limits. Multiple
calcified hilar and mediastinal lymph nodes are noted consistent
with prior granulomatous disease. No sizable adenopathy is noted at
this time. The esophagus is within normal limits.

Lungs/Pleura: The right lung is well aerated without focal
infiltrate or sizable effusion. No parenchymal nodules are seen.

The left lung demonstrates diffuse lingular and upper lobe
infiltrate with peribronchial thickening consistent with acute
pneumonia. This appears increased when compared with the prior CT of
the abdomen and pelvis but relatively stable from recent chest
x-rays. No sizable left effusion is noted.

Upper Abdomen: Visualized upper abdomen is unremarkable.

Musculoskeletal: Degenerative changes of the thoracic spine are
seen.
IMPRESSION: Lingular infiltrate increased from prior CT examination but
relatively stable from the prior chest x-ray. Continued follow-up is
recommended.

Changes of prior granulomatous disease.

Aortic Atherosclerosis ([id number]).
